# Patient Record
Sex: Male | Born: 1970 | Race: White | Hispanic: No | Marital: Married | Smoking: Former smoker
Health system: Southern US, Community
[De-identification: ages and names within clinical notes are randomized; demographics above are authoritative.]

## PROBLEM LIST (undated history)

## (undated) DIAGNOSIS — I10 Essential (primary) hypertension: Secondary | ICD-10-CM

## (undated) DIAGNOSIS — E119 Type 2 diabetes mellitus without complications: Secondary | ICD-10-CM

## (undated) HISTORY — PX: FRACTURE SURGERY: SHX138

## (undated) HISTORY — PX: ELBOW FRACTURE SURGERY: SHX616

## (undated) HISTORY — PX: JOINT REPLACEMENT: SHX530

## (undated) HISTORY — PX: WRIST ARTHROPLASTY: SHX1088

## (undated) HISTORY — PX: APPENDECTOMY: SHX54

---

## 2008-02-07 ENCOUNTER — Ambulatory Visit: Payer: Self-pay | Admitting: Endocrinology

## 2008-02-25 ENCOUNTER — Ambulatory Visit: Payer: Self-pay | Admitting: Endocrinology

## 2008-08-31 ENCOUNTER — Emergency Department: Payer: Self-pay | Admitting: Emergency Medicine

## 2010-01-19 ENCOUNTER — Emergency Department: Payer: Self-pay | Admitting: Emergency Medicine

## 2011-07-21 ENCOUNTER — Ambulatory Visit: Payer: Self-pay | Admitting: Gastroenterology

## 2011-08-16 ENCOUNTER — Ambulatory Visit: Payer: Self-pay | Admitting: Gastroenterology

## 2011-09-17 ENCOUNTER — Ambulatory Visit: Payer: Self-pay | Admitting: Orthopedic Surgery

## 2012-01-03 ENCOUNTER — Ambulatory Visit: Payer: Self-pay

## 2012-12-30 ENCOUNTER — Ambulatory Visit: Payer: Self-pay | Admitting: Orthopedic Surgery

## 2018-05-22 ENCOUNTER — Other Ambulatory Visit: Payer: Self-pay | Admitting: Family Medicine

## 2018-05-22 ENCOUNTER — Ambulatory Visit
Admission: RE | Admit: 2018-05-22 | Discharge: 2018-05-22 | Disposition: A | Discharge status: Home / Self Care | Payer: BLUE CROSS/BLUE SHIELD | Source: Ambulatory Visit | Attending: Family Medicine | Admitting: Family Medicine

## 2018-05-22 ENCOUNTER — Encounter (INDEPENDENT_AMBULATORY_CARE_PROVIDER_SITE_OTHER): Payer: Self-pay

## 2018-05-22 DIAGNOSIS — M7989 Other specified soft tissue disorders: Secondary | ICD-10-CM | POA: Insufficient documentation

## 2018-10-08 ENCOUNTER — Emergency Department: Payer: BC Managed Care – PPO

## 2018-10-08 ENCOUNTER — Other Ambulatory Visit: Payer: Self-pay

## 2018-10-08 ENCOUNTER — Emergency Department
Admission: EM | Admit: 2018-10-08 | Discharge: 2018-10-08 | Disposition: A | Discharge status: Home / Self Care | Payer: BC Managed Care – PPO | Source: Home / Self Care | Attending: Emergency Medicine | Admitting: Emergency Medicine

## 2018-10-08 DIAGNOSIS — Z7982 Long term (current) use of aspirin: Secondary | ICD-10-CM | POA: Insufficient documentation

## 2018-10-08 DIAGNOSIS — L03116 Cellulitis of left lower limb: Secondary | ICD-10-CM | POA: Insufficient documentation

## 2018-10-08 DIAGNOSIS — A4901 Methicillin susceptible Staphylococcus aureus infection, unspecified site: Secondary | ICD-10-CM | POA: Diagnosis not present

## 2018-10-08 DIAGNOSIS — L03119 Cellulitis of unspecified part of limb: Secondary | ICD-10-CM

## 2018-10-08 DIAGNOSIS — Z79899 Other long term (current) drug therapy: Secondary | ICD-10-CM | POA: Insufficient documentation

## 2018-10-08 DIAGNOSIS — Z794 Long term (current) use of insulin: Secondary | ICD-10-CM | POA: Insufficient documentation

## 2018-10-08 DIAGNOSIS — E1169 Type 2 diabetes mellitus with other specified complication: Secondary | ICD-10-CM | POA: Diagnosis not present

## 2018-10-08 LAB — COMPREHENSIVE METABOLIC PANEL
ALT: 23 U/L (ref 0–44)
AST: 18 U/L (ref 15–41)
Albumin: 4.2 g/dL (ref 3.5–5.0)
Alkaline Phosphatase: 100 U/L (ref 38–126)
Anion gap: 14 (ref 5–15)
BUN: 25 mg/dL — ABNORMAL HIGH (ref 6–20)
CO2: 18 mmol/L — ABNORMAL LOW (ref 22–32)
Calcium: 8.4 mg/dL — ABNORMAL LOW (ref 8.9–10.3)
Chloride: 105 mmol/L (ref 98–111)
Creatinine, Ser: 1.1 mg/dL (ref 0.61–1.24)
GFR calc Af Amer: >60 mL/min (ref >60)
GFR calc non Af Amer: >60 mL/min (ref >60)
Glucose, Bld: 309 mg/dL — ABNORMAL HIGH (ref 70–99)
Potassium: 4 mmol/L (ref 3.5–5.1)
Sodium: 137 mmol/L (ref 135–145)
Total Bilirubin: 1.3 mg/dL — ABNORMAL HIGH (ref 0.3–1.2)
Total Protein: 7.2 g/dL (ref 6.5–8.1)

## 2018-10-08 LAB — CBC WITH DIFFERENTIAL/PLATELET
Abs Immature Granulocytes: 0.06 10*3/uL (ref 0.00–0.07)
Basophils Absolute: 0.1 10*3/uL (ref 0.0–0.1)
Basophils Relative: 0 %
Eosinophils Absolute: 0.1 10*3/uL (ref 0.0–0.5)
Eosinophils Relative: 0 %
HCT: 42 % (ref 39.0–52.0)
Hemoglobin: 14.4 g/dL (ref 13.0–17.0)
Immature Granulocytes: 1 %
Lymphocytes Relative: 14 %
Lymphs Abs: 1.6 10*3/uL (ref 0.7–4.0)
MCH: 30.7 pg (ref 26.0–34.0)
MCHC: 34.3 g/dL (ref 30.0–36.0)
MCV: 89.6 fL (ref 80.0–100.0)
Monocytes Absolute: 1.3 10*3/uL — ABNORMAL HIGH (ref 0.1–1.0)
Monocytes Relative: 11 %
Neutro Abs: 8.8 10*3/uL — ABNORMAL HIGH (ref 1.7–7.7)
Neutrophils Relative %: 74 %
Platelets: 291 10*3/uL (ref 150–400)
RBC: 4.69 MIL/uL (ref 4.22–5.81)
RDW: 12.5 % (ref 11.5–15.5)
WBC: 11.8 10*3/uL — ABNORMAL HIGH (ref 4.0–10.5)
nRBC: 0 % (ref 0.0–0.2)

## 2018-10-08 LAB — LACTIC ACID, PLASMA: Lactic Acid, Venous: 1.6 mmol/L (ref 0.5–1.9)

## 2018-10-08 MED ORDER — VANCOMYCIN HCL IN DEXTROSE 1-5 GM/200ML-% IV SOLN
1000.0000 mg | INTRAVENOUS | Status: AC
  Administered 2018-10-08: 1000 mg via INTRAVENOUS
  Filled 2018-10-08: qty 200

## 2018-10-08 MED ORDER — SODIUM CHLORIDE 0.9 % IV BOLUS
500.0000 mL | Freq: Once | INTRAVENOUS | Status: AC
  Administered 2018-10-08: 500 mL via INTRAVENOUS

## 2018-10-08 MED ORDER — CLINDAMYCIN HCL 300 MG PO CAPS: 300.0000 mg | ORAL_CAPSULE | Freq: Three times a day (TID) | ORAL | 0 refills | Status: DC

## 2018-10-08 MED ORDER — LISINOPRIL 10 MG PO TABS: 10.0000 mg | ORAL_TABLET | Freq: Every day | ORAL | 0 refills | Status: DC

## 2018-10-08 MED ORDER — LISINOPRIL 10 MG PO TABS
10.0000 mg | ORAL_TABLET | Freq: Once | ORAL | Status: AC
  Administered 2018-10-08: 10 mg via ORAL
  Filled 2018-10-08: qty 1

## 2018-10-08 NOTE — ED Notes (Signed)
Attempted IV access x 1 unsuccessful, IV infiltrated when flushed.

## 2018-10-08 NOTE — Discharge Instructions (Signed)
You have been seen today in the Emergency Department (ED) for cellulitis, a superficial skin infection. Please take your antibiotics as prescribed for their ENTIRE prescribed duration.  Take Tylenol or Motrin as needed for pain, but only as written on the box.  ° °Please follow up with your doctor or in the ED in 24-48 hours for recheck of your infection if you are not improving.  Call your doctor sooner or return to the ED if you develop worsening signs of infection such as: increased redness, increased pain, pus, fever, or other symptoms that concern you. ° °

## 2018-10-08 NOTE — ED Triage Notes (Signed)
Pt with redness and increased head noted to top of left foot. Pt states he is not sure if he broke his foot, pt has osteogenesis imperfecta. Pt states also has a history of cellulitis. 3+ pedal pulse. Pt denies known fever.

## 2018-10-08 NOTE — ED Provider Notes (Signed)
[facility] Emergency Department Provider Note  ____________________________________________   First MD Initiated Contact with Patient 10/08/18 0825     (approximate)  I have reviewed the triage vital signs and the nursing notes.   HISTORY  Chief Complaint Foot Pain  HPI Damon Anderson is a 48 y.o. male history of osteogenesis imperfecta, hypertension  Patient reports that  for couple days had some increased redness over a spot over the top of his left foot that is painful to walk on.  He first noticed it after he been sitting at his computer desk.  He has broken the foot in the past, but has not suffered any trauma or injury to this foot.  The pain occurred after sitting at the computer and reports it looks red.  Has had cellulitis in his foot before.  He has not had a fever.  He is otherwise been feeling well except it hurts over the top of his foot  No nausea vomiting.  No fevers or chills.  No cough.  No exposure anyone with coronavirus.  He does reports that he recently ran out of his lisinopril and is due to see his primary doctor for a refill on this, but he has all his other medications  No past medical history on file.  There are no active problems to display for this patient.     Prior to Admission medications   Medication Sig Start Date End Date Taking? Authorizing Provider  aspirin 325 MG tablet Take 325 mg by mouth daily.   Yes [provider]  cetirizine (ZYRTEC) 10 MG tablet Take 10 mg by mouth daily.   Yes [provider]  insulin aspart (NOVOLOG) 100 UNIT/ML injection USE UP TO 120 UNITS VIA INSULIN PUMP DAILY 01/20/18  Yes [provider]  insulin glargine (LANTUS) 100 UNIT/ML injection Inject 56 Units into the skin daily as needed. 08/07/18 08/07/19 Yes [provider]  lisinopril-hydrochlorothiazide (ZESTORETIC) 10-12.5 MG tablet Take 1 tablet by mouth daily.   Yes [provider]  metFORMIN  (GLUCOPHAGE-XR) 500 MG 24 hr tablet Take 500 mg by mouth daily with supper. 07/19/18 07/19/19 Yes [provider]  Multiple Vitamin (MULTIVITAMIN WITH MINERALS) TABS tablet Take 1 tablet by mouth daily.   Yes [provider]  rosuvastatin (CRESTOR) 40 MG tablet Take 40 mg by mouth daily. 07/19/18  Yes [provider]  clindamycin (CLEOCIN) 300 MG capsule Take 1 capsule (300 mg total) by mouth 3 (three) times daily. 10/08/18   Sharyn CreamerQuale, Jami Ohlin, MD  lisinopril (ZESTRIL) 10 MG tablet Take 1 tablet (10 mg total) by mouth daily. 10/08/18   Sharyn CreamerQuale, Lanissa Cashen, MD    Allergies Demerol [meperidine hcl]  No family history on file.  Social History Social History   Tobacco Use  . Smoking status: Not on file  Substance Use Topics  . Alcohol use: Not on file  . Drug use: Not on file  Patient does not report any drug or alcohol abuse  Review of Systems Constitutional: No fever/chills Eyes: No visual changes. ENT: No sore throat. Cardiovascular: Denies chest pain. Respiratory: Denies shortness of breath. Gastrointestinal: No abdominal pain.   Genitourinary: Negative for dysuria. Musculoskeletal: Negative for back pain.  See HPI.  Discomfort and pain only across the top of the left foot. Skin: Negative for rash. Neurological: Negative for headaches, areas of focal weakness or numbness.    ____________________________________________   PHYSICAL EXAM:  VITAL SIGNS: ED Triage Vitals  Enc Vitals Group  BP 10/08/18 0441 (!) 159/81     Pulse Rate 10/08/18 0441 (!) 117     Resp 10/08/18 0441 18     Temp 10/08/18 0441 98.4 F (36.9 C)     Temp Source 10/08/18 0441 Oral     SpO2 10/08/18 0441 95 %     Weight 10/08/18 0442 230 lb (104.3 kg)     Height 10/08/18 0442 5\' 8"  (1.727 m)     Head Circumference --      Peak Flow --      Pain Score 10/08/18 0501 7     Pain Loc --      Pain Edu? --      Excl. in [place]? --     Constitutional: Alert and oriented. Well appearing and  in no acute distress.  He is very pleasant. Eyes: Conjunctivae are normal. Head: Atraumatic. Nose: No congestion/rhinnorhea. Mouth/Throat: Mucous membranes are moist. Neck: No stridor.  Cardiovascular: Normal rate, regular rhythm. Grossly normal heart sounds.  Good peripheral circulation. Respiratory: Normal respiratory effort.  No retractions. Lungs CTAB. Gastrointestinal: Soft and nontender. No distention. Musculoskeletal:  Lower Extremities  No edema. Normal DP/PT pulses bilateral with good cap refill.  Normal neuro-motor function lower extremities bilateral.  RIGHT Right lower extremity demonstrates normal strength, good use of all muscles. No edema bruising or contusions of the right hip, right knee, right ankle. Full range of motion of the right lower extremity without pain. No pain on axial loading. No evidence of trauma.  LEFT Left lower extremity demonstrates normal strength, good use of all muscles simply reports discomfort across the top of the left foot. No edema bruising or contusions of the hip,  knee, ankle.  He does however have an area approximately 2-1/2 x 2 inches overlying the dorsum of the foot only that starts just proximal from the toes over the top of the foot that is red, slightly warm, and mildly tender to touch.  There is no crepitance.  There is no weeping lesion or blistering.  There is no deformity.  Full range of motion of the left lower extremity without pain kept he reports discomfort with top of the foot. No pain on axial loading. No evidence of trauma.   Neurologic:  Normal speech and language. No gross focal neurologic deficits are appreciated.  Skin:  Skin is warm, dry and intact. No rash noted. Psychiatric: Mood and affect are normal. Speech and behavior are normal.  ____________________________________________   LABS (all labs ordered are listed, but only abnormal results are displayed)  Labs Reviewed  COMPREHENSIVE METABOLIC PANEL - Abnormal;  Notable for the following components:      Result Value   CO2 18 (*)    Glucose, Bld 309 (*)    BUN 25 (*)    Calcium 8.4 (*)    Total Bilirubin 1.3 (*)    All other components within normal limits  CBC WITH DIFFERENTIAL/PLATELET - Abnormal; Notable for the following components:   WBC 11.8 (*)    Neutro Abs 8.8 (*)    Monocytes Absolute 1.3 (*)    All other components within normal limits  CULTURE, BLOOD (ROUTINE X 2)  CULTURE, BLOOD (ROUTINE X 2)  LACTIC ACID, PLASMA   ____________________________________________  EKG   ____________________________________________  RADIOLOGY  Dg Foot Complete Left  Result Date: 10/08/2018 CLINICAL DATA:  Left foot pain and erythema. Osteogenesis imperfecta. EXAM: LEFT FOOT - COMPLETE 3+ VIEW COMPARISON:  None. FINDINGS: There is no evidence of acute fracture  or dislocation. Generalized osteopenia is noted. No focal lytic or sclerotic bone lesions identified. Degenerative spurring and chronic deformity of articular surface of 2nd metatarsal head noted. Mild soft tissue swelling is seen along the dorsal aspect of the distal metacarpals. No evidence of soft tissue gas or radiopaque foreign body. IMPRESSION: Mild dorsal soft tissue swelling. No acute osseous abnormality. Electronically Signed   By: Myles RosenthalJohn  Stahl M.D.   On: 10/08/2018 06:43   Imaging reviewed, dorsal foot swelling no acute fractures. ____________________________________________   PROCEDURES  Procedure(s) performed: None  Procedures  Critical Care performed: No  ____________________________________________   INITIAL IMPRESSION / ASSESSMENT AND PLAN / ED COURSE  Pertinent labs & imaging results that were available during my care of the patient were reviewed by me and considered in my medical decision making (see chart for details).   Patient presents for discomfort and pain over the dorsum of the left foot.  Appears to be atraumatic in nature with reassuring imaging.  He does  have an area that he reports he had cellulitis before as well as psoriasis or other longstanding lesion that seems to have flared up, does appear to be likely early onset of cellulitis.  He is hypertensive, fully awake and alert nontoxic-appearing.  Reports he ran of his lisinopril few days ago, will provide here and also discussed with him reviewed in [facility] and we will give him a one-month supply of his lisinopril so he can get in contact with his PCP as access has been slightly limited due to COVID.  Additionally he is minimally tachycardic on presentation, he does not meet criteria for sepsis though his white count has not exceeded 12,000 but does have a neurotrphil presence.  Reassuring exam.  No evidence of streaking or rapidly advancing infection.  Is fully awake and alert nontoxic.  Given his history of diabetes, we will send blood cultures and check a single lactic acid.  Provide some mild hydration as well as start him on vancomycin for which we discussed risks and benefits of this medication prior to initiation and after discussing risks and benefits of multiple antibiotics we will utilize clindamycin orally as outpatient and he has no history of prior C. Difficile.    Clinical Course as of Oct 07 1112  Sun Oct 08, 2018  1035 Checked on patient, vancomycin almost complete.  He reports he is feeling well.  He appears well, fully alert.  Heart rate 89, saturation 97% on room air.  Understanding and agreeable with careful return precautions, reviewed careful return precautions regarding his foot and any signs of worsening cellulitis.  Patient in agreement.  Also notes blood cultures were drawn and pending.   [MQ]    Clinical Course User Index [MQ] Sharyn CreamerQuale, Norman Bier, MD   Vitals:   10/08/18 0900 10/08/18 1058  BP: (!) 174/88 140/70  Pulse:  95  Resp: 18 18  Temp:    SpO2:  99%     ____________________________________________   FINAL CLINICAL IMPRESSION(S) / ED DIAGNOSES   Final diagnoses:  Cellulitis of foot        Note:  This document was prepared using Dragon voice recognition software and may include unintentional dictation errors       Sharyn CreamerQuale, Gerald Honea, MD 10/08/18 1114

## 2018-10-09 ENCOUNTER — Other Ambulatory Visit: Payer: Self-pay

## 2018-10-09 ENCOUNTER — Encounter: Payer: Self-pay | Admitting: Emergency Medicine

## 2018-10-09 ENCOUNTER — Inpatient Hospital Stay
Admission: EM | Admit: 2018-10-09 | Discharge: 2018-10-13 | DRG: 629 | Disposition: A | Discharge status: Home Health | Payer: BC Managed Care – PPO | Attending: Specialist | Admitting: Specialist

## 2018-10-09 DIAGNOSIS — M869 Osteomyelitis, unspecified: Secondary | ICD-10-CM | POA: Diagnosis present

## 2018-10-09 DIAGNOSIS — Z9641 Presence of insulin pump (external) (internal): Secondary | ICD-10-CM

## 2018-10-09 DIAGNOSIS — Q780 Osteogenesis imperfecta: Secondary | ICD-10-CM | POA: Diagnosis not present

## 2018-10-09 DIAGNOSIS — I10 Essential (primary) hypertension: Secondary | ICD-10-CM

## 2018-10-09 DIAGNOSIS — Z87311 Personal history of (healed) other pathological fracture: Secondary | ICD-10-CM

## 2018-10-09 DIAGNOSIS — Z79899 Other long term (current) drug therapy: Secondary | ICD-10-CM

## 2018-10-09 DIAGNOSIS — E785 Hyperlipidemia, unspecified: Secondary | ICD-10-CM | POA: Diagnosis present

## 2018-10-09 DIAGNOSIS — E119 Type 2 diabetes mellitus without complications: Secondary | ICD-10-CM | POA: Diagnosis not present

## 2018-10-09 DIAGNOSIS — E11621 Type 2 diabetes mellitus with foot ulcer: Secondary | ICD-10-CM

## 2018-10-09 DIAGNOSIS — E1140 Type 2 diabetes mellitus with diabetic neuropathy, unspecified: Secondary | ICD-10-CM | POA: Diagnosis present

## 2018-10-09 DIAGNOSIS — B9561 Methicillin susceptible Staphylococcus aureus infection as the cause of diseases classified elsewhere: Secondary | ICD-10-CM | POA: Diagnosis present

## 2018-10-09 DIAGNOSIS — R7881 Bacteremia: Secondary | ICD-10-CM | POA: Diagnosis present

## 2018-10-09 DIAGNOSIS — Z794 Long term (current) use of insulin: Secondary | ICD-10-CM | POA: Diagnosis not present

## 2018-10-09 DIAGNOSIS — L03116 Cellulitis of left lower limb: Secondary | ICD-10-CM

## 2018-10-09 DIAGNOSIS — Z872 Personal history of diseases of the skin and subcutaneous tissue: Secondary | ICD-10-CM

## 2018-10-09 DIAGNOSIS — Z87891 Personal history of nicotine dependence: Secondary | ICD-10-CM | POA: Diagnosis not present

## 2018-10-09 DIAGNOSIS — A4901 Methicillin susceptible Staphylococcus aureus infection, unspecified site: Secondary | ICD-10-CM

## 2018-10-09 DIAGNOSIS — Z1159 Encounter for screening for other viral diseases: Secondary | ICD-10-CM

## 2018-10-09 DIAGNOSIS — E1169 Type 2 diabetes mellitus with other specified complication: Secondary | ICD-10-CM | POA: Diagnosis present

## 2018-10-09 DIAGNOSIS — L539 Erythematous condition, unspecified: Secondary | ICD-10-CM

## 2018-10-09 DIAGNOSIS — Z7982 Long term (current) use of aspirin: Secondary | ICD-10-CM

## 2018-10-09 DIAGNOSIS — Z23 Encounter for immunization: Secondary | ICD-10-CM

## 2018-10-09 DIAGNOSIS — Z8614 Personal history of Methicillin resistant Staphylococcus aureus infection: Secondary | ICD-10-CM

## 2018-10-09 DIAGNOSIS — Z885 Allergy status to narcotic agent status: Secondary | ICD-10-CM

## 2018-10-09 DIAGNOSIS — E669 Obesity, unspecified: Secondary | ICD-10-CM | POA: Diagnosis present

## 2018-10-09 DIAGNOSIS — M009 Pyogenic arthritis, unspecified: Secondary | ICD-10-CM | POA: Diagnosis present

## 2018-10-09 DIAGNOSIS — F329 Major depressive disorder, single episode, unspecified: Secondary | ICD-10-CM | POA: Diagnosis present

## 2018-10-09 HISTORY — DX: Essential (primary) hypertension: I10

## 2018-10-09 HISTORY — DX: Type 2 diabetes mellitus without complications: E11.9

## 2018-10-09 LAB — BASIC METABOLIC PANEL
Anion gap: 11 (ref 5–15)
BUN: 25 mg/dL — ABNORMAL HIGH (ref 6–20)
CO2: 22 mmol/L (ref 22–32)
Calcium: 8.5 mg/dL — ABNORMAL LOW (ref 8.9–10.3)
Chloride: 104 mmol/L (ref 98–111)
Creatinine, Ser: 1.1 mg/dL (ref 0.61–1.24)
GFR calc Af Amer: >60 mL/min (ref >60)
GFR calc non Af Amer: >60 mL/min (ref >60)
Glucose, Bld: 228 mg/dL — ABNORMAL HIGH (ref 70–99)
Potassium: 3.8 mmol/L (ref 3.5–5.1)
Sodium: 137 mmol/L (ref 135–145)

## 2018-10-09 LAB — BLOOD CULTURE ID PANEL (REFLEXED)

## 2018-10-09 LAB — HEMOGLOBIN A1C
Hgb A1c MFr Bld: 8.1 % — ABNORMAL HIGH (ref 4.8–5.6)
Mean Plasma Glucose: 185.77 mg/dL

## 2018-10-09 LAB — CBC WITH DIFFERENTIAL/PLATELET
Abs Immature Granulocytes: 0.04 10*3/uL (ref 0.00–0.07)
Basophils Absolute: 0 10*3/uL (ref 0.0–0.1)
Basophils Relative: 0 %
Eosinophils Absolute: 0.1 10*3/uL (ref 0.0–0.5)
Eosinophils Relative: 1 %
HCT: 39.4 % (ref 39.0–52.0)
Hemoglobin: 13.7 g/dL (ref 13.0–17.0)
Immature Granulocytes: 1 %
Lymphocytes Relative: 11 %
Lymphs Abs: 1 10*3/uL (ref 0.7–4.0)
MCH: 30.8 pg (ref 26.0–34.0)
MCHC: 34.8 g/dL (ref 30.0–36.0)
MCV: 88.5 fL (ref 80.0–100.0)
Monocytes Absolute: 0.8 10*3/uL (ref 0.1–1.0)
Monocytes Relative: 10 %
Neutro Abs: 6.8 10*3/uL (ref 1.7–7.7)
Neutrophils Relative %: 77 %
Platelets: 289 10*3/uL (ref 150–400)
RBC: 4.45 MIL/uL (ref 4.22–5.81)
RDW: 12.5 % (ref 11.5–15.5)
WBC: 8.7 10*3/uL (ref 4.0–10.5)
nRBC: 0 % (ref 0.0–0.2)

## 2018-10-09 LAB — GLUCOSE, CAPILLARY
Glucose-Capillary: 167 mg/dL — ABNORMAL HIGH (ref 70–99)
Glucose-Capillary: 86 mg/dL (ref 70–99)

## 2018-10-09 MED ORDER — ROSUVASTATIN CALCIUM 20 MG PO TABS
40.0000 mg | ORAL_TABLET | Freq: Every day | ORAL | Status: DC
  Administered 2018-10-10 – 2018-10-13 (×4): 40 mg via ORAL
  Filled 2018-10-09 (×4): qty 4
  Filled 2018-10-09 (×4): qty 2

## 2018-10-09 MED ORDER — PNEUMOCOCCAL VAC POLYVALENT 25 MCG/0.5ML IJ INJ
0.5000 mL | INJECTION | INTRAMUSCULAR | Status: AC
  Administered 2018-10-13: 0.5 mL via INTRAMUSCULAR
  Filled 2018-10-09: qty 0.5

## 2018-10-09 MED ORDER — ACETAMINOPHEN 325 MG PO TABS
650.0000 mg | ORAL_TABLET | Freq: Four times a day (QID) | ORAL | Status: DC | PRN
  Administered 2018-10-11: 650 mg via ORAL
  Filled 2018-10-09: qty 2

## 2018-10-09 MED ORDER — HYDROCHLOROTHIAZIDE 12.5 MG PO CAPS
12.5000 mg | ORAL_CAPSULE | Freq: Every day | ORAL | Status: DC
  Administered 2018-10-10 – 2018-10-13 (×4): 12.5 mg via ORAL
  Filled 2018-10-09 (×4): qty 1

## 2018-10-09 MED ORDER — ACETAMINOPHEN 650 MG RE SUPP: 650.0000 mg | Freq: Four times a day (QID) | RECTAL | Status: DC | PRN

## 2018-10-09 MED ORDER — POLYETHYLENE GLYCOL 3350 17 G PO PACK: 17.0000 g | PACK | Freq: Every day | ORAL | Status: DC | PRN

## 2018-10-09 MED ORDER — LISINOPRIL 10 MG PO TABS
10.0000 mg | ORAL_TABLET | Freq: Every day | ORAL | Status: DC
  Administered 2018-10-10 – 2018-10-13 (×4): 10 mg via ORAL
  Filled 2018-10-09 (×4): qty 1

## 2018-10-09 MED ORDER — IBUPROFEN 400 MG PO TABS
400.0000 mg | ORAL_TABLET | Freq: Four times a day (QID) | ORAL | Status: DC | PRN
  Administered 2018-10-11: 400 mg via ORAL
  Filled 2018-10-09: qty 1

## 2018-10-09 MED ORDER — LISINOPRIL-HYDROCHLOROTHIAZIDE 10-12.5 MG PO TABS: 1.0000 | ORAL_TABLET | Freq: Every day | ORAL | Status: DC

## 2018-10-09 MED ORDER — SODIUM CHLORIDE 0.9 % IV SOLN
2.0000 g | Freq: Once | INTRAVENOUS | Status: AC
  Administered 2018-10-09: 2 g via INTRAVENOUS
  Filled 2018-10-09: qty 2000

## 2018-10-09 MED ORDER — ONDANSETRON HCL 4 MG PO TABS: 4.0000 mg | ORAL_TABLET | Freq: Four times a day (QID) | ORAL | Status: DC | PRN

## 2018-10-09 MED ORDER — ENOXAPARIN SODIUM 40 MG/0.4ML ~~LOC~~ SOLN
40.0000 mg | SUBCUTANEOUS | Status: DC
  Administered 2018-10-09 – 2018-10-12 (×4): 40 mg via SUBCUTANEOUS
  Filled 2018-10-09 (×4): qty 0.4

## 2018-10-09 MED ORDER — SODIUM CHLORIDE 0.9 % IV SOLN
2.0000 g | INTRAVENOUS | Status: DC
  Administered 2018-10-09 – 2018-10-11 (×11): 2 g via INTRAVENOUS
  Filled 2018-10-09 (×2): qty 2000
  Filled 2018-10-09: qty 2
  Filled 2018-10-09: qty 2000
  Filled 2018-10-09: qty 2
  Filled 2018-10-09 (×3): qty 2000
  Filled 2018-10-09: qty 2
  Filled 2018-10-09: qty 2000
  Filled 2018-10-09: qty 2
  Filled 2018-10-09: qty 2000
  Filled 2018-10-09: qty 2
  Filled 2018-10-09: qty 2000
  Filled 2018-10-09 (×2): qty 2
  Filled 2018-10-09: qty 2000

## 2018-10-09 MED ORDER — INSULIN PUMP
Freq: Three times a day (TID) | SUBCUTANEOUS | Status: DC
  Administered 2018-10-09: 18.2 via SUBCUTANEOUS
  Administered 2018-10-10: 03:00:00 via SUBCUTANEOUS
  Administered 2018-10-10: 1 via SUBCUTANEOUS
  Administered 2018-10-10: 10.2 via SUBCUTANEOUS
  Administered 2018-10-10: 12.3 via SUBCUTANEOUS
  Administered 2018-10-10: 15.5 via SUBCUTANEOUS
  Administered 2018-10-11: 10.8 via SUBCUTANEOUS
  Administered 2018-10-11 (×3): 1 via SUBCUTANEOUS
  Administered 2018-10-11: 18:00:00 via SUBCUTANEOUS
  Administered 2018-10-12 (×2): 1 via SUBCUTANEOUS
  Filled 2018-10-09: qty 1

## 2018-10-09 MED ORDER — ALBUTEROL SULFATE (2.5 MG/3ML) 0.083% IN NEBU: 2.5000 mg | INHALATION_SOLUTION | RESPIRATORY_TRACT | Status: DC | PRN

## 2018-10-09 MED ORDER — NAFCILLIN SODIUM 2 G IJ SOLR
2.0000 g | INTRAMUSCULAR | Status: DC
  Filled 2018-10-09 (×5): qty 2000

## 2018-10-09 MED ORDER — ASPIRIN EC 325 MG PO TBEC
325.0000 mg | DELAYED_RELEASE_TABLET | Freq: Every day | ORAL | Status: DC
  Administered 2018-10-10 – 2018-10-13 (×4): 325 mg via ORAL
  Filled 2018-10-09 (×4): qty 1

## 2018-10-09 MED ORDER — ONDANSETRON HCL 4 MG/2ML IJ SOLN: 4.0000 mg | Freq: Four times a day (QID) | INTRAMUSCULAR | Status: DC | PRN

## 2018-10-09 NOTE — ED Provider Notes (Signed)
[facility] Emergency Department Provider Note   ____________________________________________   First MD Initiated Contact with Patient 10/09/18 1402     (approximate)  I have reviewed the triage vital signs and the nursing notes.   HISTORY  Chief Complaint Abnormal Lab    HPI Damon Anderson is a 48 y.o. male recent diagnosis of cellulitis as well as history of osteogenesis imperfecta.  Patient was seen in ER and evaluated yesterday.  He was called back due to positive blood cultures.   He denies ongoing fevers and chills.  Received vancomycin ER yesterday.  He is taking clindamycin he took the first dose this morning.  Reports the area of the top of the foot pain is decreased slightly and he feels like the redness and infection looked a little better this morning, but after wearing his socks for a while the area over the top of the left foot continues to remain red but does not appear to be worsening or advancing.  History of frequent infections and cellulitis involving his foot in the past.  No nausea vomiting.  No headache fevers or chills.  No coronavirus symptoms  History reviewed. No pertinent past medical history.  There are no active problems to display for this patient.     Prior to Admission medications   Medication Sig Start Date End Date Taking? Authorizing Provider  aspirin 325 MG tablet Take 325 mg by mouth daily.    [provider]  cetirizine (ZYRTEC) 10 MG tablet Take 10 mg by mouth daily.    [provider]  clindamycin (CLEOCIN) 300 MG capsule Take 1 capsule (300 mg total) by mouth 3 (three) times daily. 10/08/18   Delman Kitten, MD  insulin aspart (NOVOLOG) 100 UNIT/ML injection USE UP TO 120 UNITS VIA INSULIN PUMP DAILY 01/20/18   [provider]  insulin glargine (LANTUS) 100 UNIT/ML injection Inject 56 Units into the skin daily as needed. 08/07/18 08/07/19  [provider]  lisinopril (ZESTRIL) 10 MG  tablet Take 1 tablet (10 mg total) by mouth daily. 10/08/18   Delman Kitten, MD  lisinopril-hydrochlorothiazide (ZESTORETIC) 10-12.5 MG tablet Take 1 tablet by mouth daily.    [provider]  metFORMIN (GLUCOPHAGE-XR) 500 MG 24 hr tablet Take 500 mg by mouth daily with supper. 07/19/18 07/19/19  [provider]  Multiple Vitamin (MULTIVITAMIN WITH MINERALS) TABS tablet Take 1 tablet by mouth daily.    [provider]  rosuvastatin (CRESTOR) 40 MG tablet Take 40 mg by mouth daily. 07/19/18   [provider]    Allergies Demerol [meperidine hcl]  No family history on file.  Social History Social History   Tobacco Use  . Smoking status: Not on file  Substance Use Topics  . Alcohol use: Not on file  . Drug use: Not on file    Review of Systems Constitutional: No fever/chills Eyes: No visual changes. ENT: No sore throat. Cardiovascular: Denies chest pain. Respiratory: Denies shortness of breath. Gastrointestinal: No abdominal pain.   Genitourinary: Negative for dysuria. Musculoskeletal: Negative for back pain.  Redness over top left foot Skin: see HPI Neurological: Negative for headaches, areas of focal weakness or numbness.    ____________________________________________   PHYSICAL EXAM:  VITAL SIGNS: ED Triage Vitals  Enc Vitals Group     BP 10/09/18 1326 (!) 152/84     Pulse Rate 10/09/18 1326 (!) 109     Resp 10/09/18 1326 20     Temp 10/09/18 1326 98.5 F (  36.9 C)     Temp Source 10/09/18 1326 Oral     SpO2 10/09/18 1326 100 %     Weight 10/09/18 1328 230 lb (104.3 kg)     Height 10/09/18 1328 5\' 8"  (1.727 m)     Head Circumference --      Peak Flow --      Pain Score 10/09/18 1327 4     Pain Loc --      Pain Edu? --      Excl. in [place]? --     Constitutional: Alert and oriented. Well appearing and in no acute distress. Eyes: Conjunctivae are normal. Head: Atraumatic. Nose: No congestion/rhinnorhea. Mouth/Throat: Mucous  membranes are moist. Neck: No stridor.  Cardiovascular: Normal rate, regular rhythm. Grossly normal heart sounds.  Good peripheral circulation. Respiratory: Normal respiratory effort.  No retractions. Lungs CTAB. Gastrointestinal: Soft and nontender. No distention. Musculoskeletal: No lower extremity tenderness nor edema septa over the top dorsal surface of the left foot there is and remains a patch of erythema around this and warmth about 2 x 3 cm in size and does not appear to spread notably from visit yesterday, appears stable if not anything may be slightly less erythematous. Neurologic:  Normal speech and language. No gross focal neurologic deficits are appreciated.  Skin:  Skin is warm, dry and intact. No rash noted. Psychiatric: Mood and affect are normal. Speech and behavior are normal.  ____________________________________________   LABS (all labs ordered are listed, but only abnormal results are displayed)  Labs Reviewed  NOVEL CORONAVIRUS, NAA (HOSPITAL ORDER, SEND-OUT TO REF LAB)  CULTURE, BLOOD (ROUTINE X 2)  CULTURE, BLOOD (ROUTINE X 2)  CBC WITH DIFFERENTIAL/PLATELET  BASIC METABOLIC PANEL   ____________________________________________  EKG   ____________________________________________  RADIOLOGY   ____________________________________________   PROCEDURES  Procedure(s) performed: None  Procedures  Critical Care performed: No  ____________________________________________   INITIAL IMPRESSION / ASSESSMENT AND PLAN / ED COURSE  Pertinent labs & imaging results that were available during my care of the patient were reviewed by me and considered in my medical decision making (see chart for details).   Case and care discussed with hospitalist, Dr. Elpidio AnisSudini.  Because of the patient's positive blood cultures, patient will be admitted for further care and work-up under the hospitalist service who requested repeat CBC BMP and blood cultures today.  Discussed  antibiotic choice with hospitalist, will start nafcillin at this time.     Labs reviewed, normal white count today ____________________________________________   FINAL CLINICAL IMPRESSION(S) / ED DIAGNOSES  Final diagnoses:  MSSA (methicillin susceptible Staphylococcus aureus) infection  Cellulitis of left lower extremity        Note:  This document was prepared using Dragon voice recognition software and may include unintentional dictation errors       Sharyn CreamerQuale, Ketzaly Cardella, MD 10/09/18 1524

## 2018-10-09 NOTE — Consult Note (Signed)
NAME: Damon CharlestonRobert Anderson  DOB: 10/04/1970  MRN: [id number]  Date/Time: 10/09/2018 6:13 PM  REQUESTING PROVIDER: Dr. Elpidio AnisSudini Subjective:  REASON FOR CONSULT: Staph aureus bacteremia ?  48 year old male with a history of diabetes mellitus, hypertension, osteogenesis imperfecta,  Is admitted to the hospital because of staph aureus and blood culture.  Patient had presented to the ED yesterday with redness and a knot present on the top of the left foot which he had noticed after sitting at the computer.  He has had cellulitis in that foot before.  He did not have any fever.  Vitals in the ED was noted to be BP of 159/81, heart rate of 117, temperature of 98.4.  Blood cultures were sent.  WBC was 11.8.  X-ray of the foot showed no evidence of acute fracture or dislocation.  He was given a dose of vancomycin and nafcillin and discharged home on clindamycin.  As the blood culture came back as staph aureus he was called back to the ED to be admitted to the hospital.  I am seeing the patient for the same. As per patient he has itching over his legs and has had numerous skin lesions . In the past the left foot had become cellulitic and recently as of April 2020 his PCP had given him topical steroids for possible psoriatic lesions legs He has h/o MRSA many years ago at the site of heparin injections when he was in rehab after a fracture femur    Past medical history Diabetes Hyperlipidemia Hypertension Osteogenesis imperfecta Obesity Osteoarthritis Depression Cellulitis of the legs with swelling of the legs.  For left foot cellulitis in January 2020  Surgical history Appendectomy Arthroplasty hip Fracture right hand  Femoral fracture left with repositioning with screws  Social history Former smoker Drinks beer socially Lives on his own   Family History  Problem Relation Age of Onset  . Diabetes Mother   . Hypertension Father   Asthma Brother Diabetes Brother Stroke Brother   Allergies   Allergen Reactions  . Demerol [Meperidine Hcl] Nausea And Vomiting    ? Current Facility-Administered Medications  Medication Dose Route Frequency Provider Last Rate Last Dose  . acetaminophen (TYLENOL) tablet 650 mg  650 mg Oral Q6H PRN Milagros LollSudini, Srikar, MD       Or  . acetaminophen (TYLENOL) suppository 650 mg  650 mg Rectal Q6H PRN Sudini, Srikar, MD      . albuterol (PROVENTIL) (2.5 MG/3ML) 0.083% nebulizer solution 2.5 mg  2.5 mg Nebulization Q2H PRN Sudini, Srikar, MD      . enoxaparin (LOVENOX) injection 40 mg  40 mg Subcutaneous Q24H Sudini, Srikar, MD      . ibuprofen (ADVIL) tablet 400 mg  400 mg Oral Q6H PRN Sudini, Srikar, MD      . insulin pump   Subcutaneous TID AC, HS, 0200 Sudini, Srikar, MD      . ondansetron (ZOFRAN) tablet 4 mg  4 mg Oral Q6H PRN Sudini, Wardell HeathSrikar, MD       Or  . ondansetron (ZOFRAN) injection 4 mg  4 mg Intravenous Q6H PRN Sudini, Srikar, MD      . polyethylene glycol (MIRALAX / GLYCOLAX) packet 17 g  17 g Oral Daily PRN Milagros LollSudini, Srikar, MD         Abtx:  Anti-infectives (From admission, onward)   Start     Dose/Rate Route Frequency Ordered Stop   10/09/18 1430  nafcillin 2 g in sodium chloride 0.9 % 100 mL IVPB  2 g 200 mL/hr over 30 Minutes Intravenous  Once 10/09/18 1416 10/09/18 1626      REVIEW OF SYSTEMS:  Const: negative fever, negative chills, negative weight loss Eyes: negative diplopia or visual changes, negative eye pain ENT: negative coryza, negative sore throat Resp: negative cough, hemoptysis, dyspnea Cards: negative for chest pain, palpitations, lower extremity edema GU: negative for frequency, dysuria and hematuria GI: Negative for abdominal pain, diarrhea, bleeding, constipation Skin: negative for rash and pruritus Heme: negative for easy bruising and gum/nose bleeding MS: negative for myalgias, arthralgias, back pain and muscle weakness, left foot pain Neurolo:negative for headaches, dizziness, vertigo, memory problems   Psych:  anxiety Endocrine: has insulin pump Allergy/Immunology- as above Objective:  VITALS:  BP 129/77 (BP Location: Right Arm)   Pulse 95   Temp 99.5 F (37.5 C) (Oral)   Resp 16   Ht 5\' 8"  (1.727 m)   Wt 104.3 kg   SpO2 99%   BMI 34.97 kg/m  PHYSICAL EXAM:  General: Alert, cooperative, no distress, appears stated age. pale Head: Normocephalic, without obvious abnormality, atraumatic. Eyes: Conjunctivae clear, anicteric sclerae. Pupils are equal Edema under eyes ENT did  Not examine as he has a mask Neck: Supple, symmetrical, no adenopathy, thyroid: non tender no carotid bruit and no JVD. Back: No CVA tenderness. Lungs: b/l air entry Heart: s1s2 Abdomen: Soft, obese Extremities: excoriated lesions of various healing state, with scars over the shins        Left foot- erythematous, swollen, tender Lymph: Cervical, supraclavicular normal. Neurologic: Grossly non-focal Pertinent Labs Lab Results CBC    Component Value Date/Time   WBC 8.7 10/09/2018 1440   RBC 4.45 10/09/2018 1440   HGB 13.7 10/09/2018 1440   HCT 39.4 10/09/2018 1440   PLT 289 10/09/2018 1440   MCV 88.5 10/09/2018 1440   MCH 30.8 10/09/2018 1440   MCHC 34.8 10/09/2018 1440   RDW 12.5 10/09/2018 1440   LYMPHSABS 1.0 10/09/2018 1440   MONOABS 0.8 10/09/2018 1440   EOSABS 0.1 10/09/2018 1440   BASOSABS 0.0 10/09/2018 1440    CMP Latest Ref Rng & Units 10/09/2018 10/08/2018  Glucose 70 - 99 mg/dL 228(H) 309(H)  BUN 6 - 20 mg/dL 25(H) 25(H)  Creatinine 0.61 - 1.24 mg/dL 1.10 1.10  Sodium 135 - 145 mmol/L 137 137  Potassium 3.5 - 5.1 mmol/L 3.8 4.0  Chloride 98 - 111 mmol/L 104 105  CO2 22 - 32 mmol/L 22 18(L)  Calcium 8.9 - 10.3 mg/dL 8.5(L) 8.4(L)  Total Protein 6.5 - 8.1 g/dL - 7.2  Total Bilirubin 0.3 - 1.2 mg/dL - 1.3(H)  Alkaline Phos 38 - 126 U/L - 100  AST 15 - 41 U/L - 18  ALT 0 - 44 U/L - 23      Microbiology: Recent Results (from the past 240 hour(s))  Blood Culture  (routine x 2)     Status: None (Preliminary result)   Collection Time: 10/08/18  9:46 AM   Specimen: BLOOD  Result Value Ref Range Status   Specimen Description   Final    BLOOD L HAND Performed at [facility], [address] [place], [place] [address]    Special Requests   Final    BOTTLES DRAWN AEROBIC AND ANAEROBIC Blood Culture adequate volume Performed at [facility], [address] [place], [place] [address]    Culture  Setup Time   Final    AEROBIC BOTTLE ONLY GRAM POSITIVE COCCI CRITICAL RESULT CALLED TO, READ  BACK BY AND VERIFIED WITH: LISA CLUTTZ AT 0410 ON 10/09/2018 JJB Performed at [facility], [address] [place], [place] [address]    Culture GRAM POSITIVE COCCI  Final   Report Status PENDING  Incomplete  Blood Culture (routine x 2)     Status: None (Preliminary result)   Collection Time: 10/08/18  9:46 AM   Specimen: BLOOD  Result Value Ref Range Status   Specimen Description BLOOD LAC  Final   Special Requests   Final    BOTTLES DRAWN AEROBIC AND ANAEROBIC Blood Culture adequate volume   Culture  Setup Time   Final    AEROBIC BOTTLE ONLY GRAM POSITIVE COCCI CRITICAL RESULT CALLED TO, READ BACK BY AND VERIFIED WITH: LISA CLUTTZ AT 0410 ON 10/09/2018 JJB Performed at [facility], [address] [place], [place] [address]    Culture GRAM POSITIVE COCCI  Final   Report Status PENDING  Incomplete  Blood Culture ID Panel (Reflexed)     Status: Abnormal   Collection Time: 10/08/18  9:46 AM  Result Value Ref Range Status   Enterococcus species NOT DETECTED NOT DETECTED Final   Listeria monocytogenes NOT DETECTED NOT DETECTED Final   Staphylococcus species DETECTED (A) NOT DETECTED Final    Comment: CRITICAL RESULT CALLED TO, READ BACK BY AND VERIFIED WITH: LISA CLUTTZ AT 0410 ON 10/09/2018 JJB    Staphylococcus aureus (BCID) DETECTED (A) NOT DETECTED Final    Comment: Methicillin (oxacillin) susceptible Staphylococcus  aureus (MSSA). Preferred therapy is anti staphylococcal beta lactam antibiotic (Cefazolin or Nafcillin), unless clinically contraindicated. CRITICAL RESULT CALLED TO, READ BACK BY AND VERIFIED WITH: LISA CLUTTZ AT 0410 ON 10/09/2018 JJB    Methicillin resistance NOT DETECTED NOT DETECTED Final   Streptococcus species NOT DETECTED NOT DETECTED Final   Streptococcus agalactiae NOT DETECTED NOT DETECTED Final   Streptococcus pneumoniae NOT DETECTED NOT DETECTED Final   Streptococcus pyogenes NOT DETECTED NOT DETECTED Final   Acinetobacter baumannii NOT DETECTED NOT DETECTED Final   Enterobacteriaceae species NOT DETECTED NOT DETECTED Final   Enterobacter cloacae complex NOT DETECTED NOT DETECTED Final   Escherichia coli NOT DETECTED NOT DETECTED Final   Klebsiella oxytoca NOT DETECTED NOT DETECTED Final   Klebsiella pneumoniae NOT DETECTED NOT DETECTED Final   Proteus species NOT DETECTED NOT DETECTED Final   Serratia marcescens NOT DETECTED NOT DETECTED Final   Haemophilus influenzae NOT DETECTED NOT DETECTED Final   Neisseria meningitidis NOT DETECTED NOT DETECTED Final   Pseudomonas aeruginosa NOT DETECTED NOT DETECTED Final   Candida albicans NOT DETECTED NOT DETECTED Final   Candida glabrata NOT DETECTED NOT DETECTED Final   Candida krusei NOT DETECTED NOT DETECTED Final   Candida parapsilosis NOT DETECTED NOT DETECTED Final   Candida tropicalis NOT DETECTED NOT DETECTED Final    Comment: Performed at [facility], [address] [place], [place] [address]    IMAGING RESULTS: I have personally reviewed the films ? Impression/Recommendation ? ?Staph aureus bacteremia: Likely source left foot. On nafcillin. We will repeat blood cultures until clear of bacteria. Need podiatrist consult May need MRI of the left foot.   Osteogenesis imperfecta with history of fractures in the past  Diabetes mellitus on insulin  pump.  Last hemoglobin A1c was 9  Hyperlipidemia on  rosuvastatin   ? ___________________________________________________ Discussed with patient and requesting provider Note:  This document was prepared using Dragon voice recognition software and may include unintentional dictation errors.

## 2018-10-09 NOTE — ED Notes (Signed)
ED TO INPATIENT HANDOFF REPORT  ED Nurse Name and Phone #:  Jinny Blossom [phone]  S Name/Age/Gender Damon Anderson 48 y.o. male Room/Bed: ED35A/ED35A  Code Status   Code Status: Full Code  Home/SNF/Other Home Patient oriented to: self, place, time and situation Is this baseline? Yes   Triage Complete: Triage complete  Chief Complaint abnormal lab  Triage Note States was called in by lab due to positive blood cultures. Patient had been seen yesterday for redness to foot.    Allergies Allergies  Allergen Reactions  . Demerol [Meperidine Hcl] Nausea And Vomiting    Level of Care/Admitting Diagnosis ED Disposition    ED Disposition Condition [facility]: [place] [[id number]]  Level of Care: Med-Surg [16]  Covid Evaluation: N/A  Diagnosis: MSSA bacteremia [5009381]  Admitting Physician: Hillary Bow [[id number]]  Attending Physician: Hillary Bow [[id number]]  Estimated length of stay: past midnight tomorrow  Certification:: I certify this patient will need inpatient services for at least 2 midnights  PT Class (Do Not Modify): Inpatient [101]  PT Acc Code (Do Not Modify): Private [1]       B Medical/Surgery History History reviewed. No pertinent past medical history.    A IV Location/Drains/Wounds Patient Lines/Drains/Airways Status   Active Line/Drains/Airways    Name:   Placement date:   Placement time:   Site:   Days:   Peripheral IV 10/09/18 Right Antecubital   10/09/18    1450    Antecubital   less than 1          Intake/Output Last 24 hours No intake or output data in the 24 hours ending 10/09/18 1525  Labs/Imaging Results for orders placed or performed during the hospital encounter of 10/09/18 (from the past 48 hour(s))  CBC WITH DIFFERENTIAL     Status: None   Collection Time: 10/09/18  2:40 PM  Result Value Ref Range   WBC 8.7 4.0 - 10.5 K/uL   RBC 4.45 4.22 - 5.81 MIL/uL   Hemoglobin 13.7 13.0 - 17.0 g/dL   HCT 39.4 39.0 - 52.0 %   MCV 88.5 80.0 - 100.0 fL   MCH 30.8 26.0 - 34.0 pg   MCHC 34.8 30.0 - 36.0 g/dL   RDW 12.5 11.5 - 15.5 %   Platelets 289 150 - 400 K/uL   nRBC 0.0 0.0 - 0.2 %   Neutrophils Relative % 77 %   Neutro Abs 6.8 1.7 - 7.7 K/uL   Lymphocytes Relative 11 %   Lymphs Abs 1.0 0.7 - 4.0 K/uL   Monocytes Relative 10 %   Monocytes Absolute 0.8 0.1 - 1.0 K/uL   Eosinophils Relative 1 %   Eosinophils Absolute 0.1 0.0 - 0.5 K/uL   Basophils Relative 0 %   Basophils Absolute 0.0 0.0 - 0.1 K/uL   Immature Granulocytes 1 %   Abs Immature Granulocytes 0.04 0.00 - 0.07 K/uL    Comment: Performed at [facility], [address] [place], [place] [address]  Basic metabolic panel     Status: Abnormal   Collection Time: 10/09/18  2:40 PM  Result Value Ref Range   Sodium 137 135 - 145 mmol/L   Potassium 3.8 3.5 - 5.1 mmol/L   Chloride 104 98 - 111 mmol/L   CO2 22 22 - 32 mmol/L   Glucose, Bld 228 (H) 70 - 99 mg/dL   BUN 25 (H) 6 - 20 mg/dL   Creatinine, Ser 1.10 0.61 - 1.24  mg/dL   Calcium 8.5 (L) 8.9 - 10.3 mg/dL   GFR calc non Af Amer >60 >60 mL/min   GFR calc Af Amer >60 >60 mL/min   Anion gap 11 5 - 15    Comment: Performed at [facility], [address] [place], [place] [address]   Dg Foot Complete Left  Result Date: 10/08/2018 CLINICAL DATA:  Left foot pain and erythema. Osteogenesis imperfecta. EXAM: LEFT FOOT - COMPLETE 3+ VIEW COMPARISON:  None. FINDINGS: There is no evidence of acute fracture or dislocation. Generalized osteopenia is noted. No focal lytic or sclerotic bone lesions identified. Degenerative spurring and chronic deformity of articular surface of 2nd metatarsal head noted. Mild soft tissue swelling is seen along the dorsal aspect of the distal metacarpals. No evidence of soft tissue gas or radiopaque foreign body. IMPRESSION: Mild dorsal soft tissue swelling. No acute osseous abnormality. Electronically Signed   By: Myles RosenthalJohn  Stahl M.D.    On: 10/08/2018 06:43    Pending Labs Unresulted Labs (From admission, onward)    Start     Ordered   10/16/18 0500  Creatinine, serum  (enoxaparin (LOVENOX)    CrCl >/= 30 ml/min)  Weekly,   STAT    Comments: while on enoxaparin therapy    10/09/18 1514   10/10/18 0500  Basic metabolic panel  Tomorrow morning,   STAT     10/09/18 1514   10/10/18 0500  CBC  Tomorrow morning,   STAT     10/09/18 1514   10/09/18 1514  HIV antibody (Routine Testing)  Add-on,   AD     10/09/18 1514   10/09/18 1514  Hemoglobin A1c  Add-on,   AD     10/09/18 1514   10/09/18 1513  Creatinine, serum  (enoxaparin (LOVENOX)    CrCl >/= 30 ml/min)  Once,   STAT    Comments: Baseline for enoxaparin therapy IF NOT ALREADY DRAWN.    10/09/18 1514   10/09/18 1416  Blood Culture (routine x 2)  BLOOD CULTURE X 2,   STAT     10/09/18 1416   10/09/18 1408  Novel Coronavirus,NAA,(SEND-OUT TO REF LAB - TAT 24-48 hrs); Hosp Order  (Asymptomatic Patients Labs)  ONCE - STAT,   STAT    Question:  Rule Out  Answer:  Yes   10/09/18 1408          Vitals/Pain Today's Vitals   10/09/18 1326 10/09/18 1327 10/09/18 1328  BP: (!) 152/84    Pulse: (!) 109    Resp: 20    Temp: 98.5 F (36.9 C)    TempSrc: Oral    SpO2: 100%    Weight:   104.3 kg  Height:   5\' 8"  (1.727 m)  PainSc:  4      Isolation Precautions No active isolations  Medications Medications  nafcillin 2 g in sodium chloride 0.9 % 100 mL IVPB (has no administration in time range)  enoxaparin (LOVENOX) injection 40 mg (has no administration in time range)  acetaminophen (TYLENOL) tablet 650 mg (has no administration in time range)    Or  acetaminophen (TYLENOL) suppository 650 mg (has no administration in time range)  polyethylene glycol (MIRALAX / GLYCOLAX) packet 17 g (has no administration in time range)  ibuprofen (ADVIL) tablet 400 mg (has no administration in time range)  ondansetron (ZOFRAN) tablet 4 mg (has no administration in time range)     Or  ondansetron (ZOFRAN) injection 4 mg (has no administration in  time range)  albuterol (PROVENTIL) (2.5 MG/3ML) 0.083% nebulizer solution 2.5 mg (has no administration in time range)  insulin pump (has no administration in time range)    Mobility walks Low fall risk   Focused Assessments See assessment   R Recommendations: See Admitting Provider Note  Report given to:   Additional Notes:

## 2018-10-09 NOTE — Progress Notes (Signed)
Advance care planning  Purpose of Encounter MSSA bacteremia and left foot cellulitis  Parties in Attendance Patient  Patients Decisional capacity Alert and oriented.  Able to make medical decisions.  No documented healthcare power of attorney.  But tells me he wants his estranged wife to make decisions if he is unable to.  Her name is Mathenia,CATHLEEN  Discussed in detail regarding MSSA bacteremia and left foot cellulitis.  Treatment plan , prognosis discussed.  All questions answered.  CODE STATUS discussed.  Patient wishes to be a full code at this point.  He tells me that he is unable to get out of bed and take care of himself he would want to be made a DNR/DNI.  Advised patient to have his healthcare power of attorney and ACP documents completed while in the hospital.  Orders entered entered and CODE STATUS changed  Full code  Time spent - 17 minutes

## 2018-10-09 NOTE — Progress Notes (Signed)
Pt Admitted to room 141. Pt is waiting for someone to deliver a part for his insulin pump but has administered his dinner insulin dose. VSS. Updated on POC. In no acute distress at this time. L foot marked around redness.

## 2018-10-09 NOTE — ED Triage Notes (Signed)
States was called in by lab due to positive blood cultures. Patient had been seen yesterday for redness to foot.

## 2018-10-09 NOTE — H&P (Signed)
[facility] - [place] [facility]   PATIENT NAME: Damon CharlestonRobert Anderson    MR#:  [id number]  DATE OF BIRTH:  08/04/1970  DATE OF ADMISSION:  10/09/2018  PRIMARY CARE PHYSICIAN: Burnard LeighHadler, Nortin M, MD   REQUESTING/REFERRING PHYSICIAN: Dr. Fanny BienQuale  CHIEF COMPLAINT:   Chief Complaint  Patient presents with  . Abnormal Lab    HISTORY OF PRESENT ILLNESS:  Damon CharlestonRobert Wiens  is a 48 y.o. male with a known history of diabetes, hyperlipidemia, osteogenesis imperfecta presents to the emergency room for second visit after he was called back due to positive blood cultures collected yesterday.  Patient was seen in the emergency room on 10/08/2018 for left foot cellulitis.  Was given 1 dose of IV vancomycin, blood cultures drawn.  Sent home on clindamycin.  Patient was called in today as his blood cultures have MSSA.  Patient feels his swelling and redness have worsened a little bit.  No discharge.  He does have an area of dry skin that he has had for many years that he used to scratch significantly in the past.  Has been using some unknown topical cream prescribed by his primary care physician and has not been scratching that area.  No fever.  He does feel rundown and has had some palpitations since yesterday.  PAST MEDICAL HISTORY:  History reviewed. No pertinent past medical history.  DM, HL Osteogenesis imperfecta  PAST SURGICAL HISTORY:  None  SOCIAL HISTORY:   Social History   Tobacco Use  . Smoking status: Not on file  Substance Use Topics  . Alcohol use: Not on file    FAMILY HISTORY:   Family History  Problem Relation Age of Onset  . Diabetes Mother   . Hypertension Father     DRUG ALLERGIES:   Allergies  Allergen Reactions  . Demerol [Meperidine Hcl] Nausea And Vomiting    REVIEW OF SYSTEMS:   Review of Systems  Constitutional: Positive for malaise/fatigue. Negative for chills and fever.  HENT: Negative for sore throat.   Eyes: Negative for blurred vision,  double vision and pain.  Respiratory: Negative for cough, hemoptysis, shortness of breath and wheezing.   Cardiovascular: Positive for palpitations. Negative for chest pain, orthopnea and leg swelling.  Gastrointestinal: Negative for abdominal pain, constipation, diarrhea, heartburn, nausea and vomiting.  Genitourinary: Negative for dysuria and hematuria.  Musculoskeletal: Negative for back pain and joint pain.  Skin: Negative for rash.  Neurological: Negative for sensory change, speech change, focal weakness and headaches.  Endo/Heme/Allergies: Does not bruise/bleed easily.  Psychiatric/Behavioral: Negative for depression. The patient is not nervous/anxious.     MEDICATIONS AT HOME:   Prior to Admission medications   Medication Sig Start Date End Date Taking? Authorizing Provider  aspirin 325 MG tablet Take 325 mg by mouth daily.   Yes [provider]  cetirizine (ZYRTEC) 10 MG tablet Take 10 mg by mouth daily.   Yes [provider]  clindamycin (CLEOCIN) 300 MG capsule Take 1 capsule (300 mg total) by mouth 3 (three) times daily. 10/08/18  Yes Sharyn CreamerQuale, Mark, MD  insulin aspart (NOVOLOG) 100 UNIT/ML injection Inject 120 Units into the skin as directed.  01/20/18  Yes [provider]  insulin glargine (LANTUS) 100 UNIT/ML injection Inject 56 Units into the skin daily as needed (backup insulin therapy).  08/07/18 08/07/19 Yes [provider]  lisinopril-hydrochlorothiazide (ZESTORETIC) 10-12.5 MG tablet Take 1 tablet by mouth daily.   Yes [provider]  metFORMIN (GLUCOPHAGE-XR) 500 MG 24  hr tablet Take 500 mg by mouth daily with supper. 07/19/18 07/19/19 Yes [provider]  Multiple Vitamin (MULTIVITAMIN WITH MINERALS) TABS tablet Take 1 tablet by mouth daily.   Yes [provider]  rosuvastatin (CRESTOR) 40 MG tablet Take 40 mg by mouth daily. 07/19/18  Yes [provider]  lisinopril (ZESTRIL) 10 MG tablet Take 1 tablet (10  mg total) by mouth daily. 10/08/18   Sharyn CreamerQuale, Mark, MD     VITAL SIGNS:  Blood pressure (!) 152/84, pulse (!) 109, temperature 98.5 F (36.9 C), temperature source Oral, resp. rate 20, height 5\' 8"  (1.727 m), weight 104.3 kg, SpO2 100 %.  PHYSICAL EXAMINATION:  Physical Exam  GENERAL:  48 y.o.-year-old patient lying in the bed with no acute distress.  EYES: Pupils equal, round, reactive to light and accommodation. No scleral icterus. Extraocular muscles intact.  HEENT: Head atraumatic, normocephalic. Oropharynx and nasopharynx clear. No oropharyngeal erythema, moist oral mucosa  NECK:  Supple, no jugular venous distention. No thyroid enlargement, no tenderness.  LUNGS: Normal breath sounds bilaterally, no wheezing, rales, rhonchi. No use of accessory muscles of respiration.  CARDIOVASCULAR: S1, S2 normal. No murmurs, rubs, or gallops.  ABDOMEN: Soft, nontender, nondistended. Bowel sounds present. No organomegaly or mass.  EXTREMITIES: No pedal edema, cyanosis, or clubbing. + 2 pedal & radial pulses b/l.   NEUROLOGIC: Cranial nerves II through XII are intact. No focal Motor or sensory deficits appreciated b/l PSYCHIATRIC: The patient is alert and oriented x 3. Good affect.  SKIN: No obvious rash, lesion, or ulcer.   LABORATORY PANEL:   CBC Recent Labs  Lab 10/09/18 1440  WBC 8.7  HGB 13.7  HCT 39.4  PLT 289   ------------------------------------------------------------------------------------------------------------------  Chemistries  Recent Labs  Lab 10/08/18 0508  NA 137  K 4.0  CL 105  CO2 18*  GLUCOSE 309*  BUN 25*  CREATININE 1.10  CALCIUM 8.4*  AST 18  ALT 23  ALKPHOS 100  BILITOT 1.3*   ------------------------------------------------------------------------------------------------------------------  Cardiac Enzymes No results for input(s): TROPONINI in the last 168  hours. ------------------------------------------------------------------------------------------------------------------  RADIOLOGY:  Dg Foot Complete Left  Result Date: 10/08/2018 CLINICAL DATA:  Left foot pain and erythema. Osteogenesis imperfecta. EXAM: LEFT FOOT - COMPLETE 3+ VIEW COMPARISON:  None. FINDINGS: There is no evidence of acute fracture or dislocation. Generalized osteopenia is noted. No focal lytic or sclerotic bone lesions identified. Degenerative spurring and chronic deformity of articular surface of 2nd metatarsal head noted. Mild soft tissue swelling is seen along the dorsal aspect of the distal metacarpals. No evidence of soft tissue gas or radiopaque foreign body. IMPRESSION: Mild dorsal soft tissue swelling. No acute osseous abnormality. Electronically Signed   By: Myles RosenthalJohn  Stahl M.D.   On: 10/08/2018 06:43     IMPRESSION AND PLAN:   *MSSA bacteremia secondary to left foot cellulitis.  Will admit patient with IV nafcillin.  Due to palpitations will place him on telemetry monitoring.  Repeat blood cultures.  Ordered CBC and BMP.  Check echocardiogram.  Consult infectious disease.  Discussed with Dr. Noralee Spaceavi Shankar.  *Diabetes mellitus.  Continue patient's home insulin pump.  Order set used.  *Hypertension.  Continue home medications  *DVT prophylaxis with Lovenox  All the records are reviewed and case discussed with ED provider. Management plans discussed with the patient, family and they are in agreement.  CODE STATUS: Full code  TOTAL TIME TAKING CARE OF THIS PATIENT: 40 minutes.   Orie FishermanSrikar R Nettie Cromwell M.D on 10/09/2018 at 3:16  PM  Between 7am to 6pm - Pager - [phone]  After 6pm go to [email or web address] - password [email or web address] [facility]  Office  [phone]  CC: Primary care physician; Leafy Half, MD  Note: This dictation was prepared with Dragon dictation along with smaller phrase technology. Any transcriptional errors that result from this  process are unintentional.

## 2018-10-09 NOTE — Progress Notes (Signed)
PHARMACY - ANTIMICROBIAL STEWARDSHIP  Patient noted to have blood cultures from 6/14 growing MSSA (per BCID).  He was seen in ED 6/14 for cellulitis.  Given vanco x 1 IV in ED then home on clindamycin PO.    Contacted patient to inform him of positive Bcx and to report back to ED and that he will require admission for further work-up and treatment. He understands and is agreeable.    Doreene Eland, PharmD, BCPS.   Work Cell: [phone] 10/09/2018 11:52 AM

## 2018-10-10 ENCOUNTER — Inpatient Hospital Stay: Payer: BC Managed Care – PPO

## 2018-10-10 ENCOUNTER — Inpatient Hospital Stay (HOSPITAL_COMMUNITY)
Admit: 2018-10-10 | Discharge: 2018-10-10 | Disposition: A | Discharge status: Home / Self Care | Payer: BC Managed Care – PPO | Attending: Internal Medicine | Admitting: Internal Medicine

## 2018-10-10 DIAGNOSIS — R7881 Bacteremia: Secondary | ICD-10-CM

## 2018-10-10 LAB — CBC
HCT: 37.4 % — ABNORMAL LOW (ref 39.0–52.0)
Hemoglobin: 13.1 g/dL (ref 13.0–17.0)
MCH: 30.8 pg (ref 26.0–34.0)
MCHC: 35 g/dL (ref 30.0–36.0)
MCV: 88 fL (ref 80.0–100.0)
Platelets: 299 10*3/uL (ref 150–400)
RBC: 4.25 MIL/uL (ref 4.22–5.81)
RDW: 12.4 % (ref 11.5–15.5)
WBC: 8.1 10*3/uL (ref 4.0–10.5)
nRBC: 0 % (ref 0.0–0.2)

## 2018-10-10 LAB — NOVEL CORONAVIRUS, NAA (HOSP ORDER, SEND-OUT TO REF LAB; TAT 18-24 HRS): SARS-CoV-2, NAA: NOT DETECTED

## 2018-10-10 LAB — GLUCOSE, CAPILLARY
Glucose-Capillary: 123 mg/dL — ABNORMAL HIGH (ref 70–99)
Glucose-Capillary: 123 mg/dL — ABNORMAL HIGH (ref 70–99)
Glucose-Capillary: 118 mg/dL — ABNORMAL HIGH (ref 70–99)
Glucose-Capillary: 92 mg/dL (ref 70–99)
Glucose-Capillary: 61 mg/dL — ABNORMAL LOW (ref 70–99)
Glucose-Capillary: 71 mg/dL (ref 70–99)

## 2018-10-10 LAB — BASIC METABOLIC PANEL
Anion gap: 10 (ref 5–15)
BUN: 19 mg/dL (ref 6–20)
CO2: 23 mmol/L (ref 22–32)
Calcium: 8.5 mg/dL — ABNORMAL LOW (ref 8.9–10.3)
Chloride: 110 mmol/L (ref 98–111)
Creatinine, Ser: 1.02 mg/dL (ref 0.61–1.24)
GFR calc Af Amer: >60 mL/min (ref >60)
GFR calc non Af Amer: >60 mL/min (ref >60)
Glucose, Bld: 98 mg/dL (ref 70–99)
Potassium: 3.5 mmol/L (ref 3.5–5.1)
Sodium: 143 mmol/L (ref 135–145)

## 2018-10-10 LAB — ECHOCARDIOGRAM COMPLETE
Height: 68 in
Weight: 3679.04 oz

## 2018-10-10 LAB — MRSA PCR SCREENING: MRSA by PCR: NEGATIVE

## 2018-10-10 MED ORDER — ENSURE PRE-SURGERY PO LIQD
296.0000 mL | Freq: Once | ORAL | Status: AC
  Administered 2018-10-10: 237 mL via ORAL
  Filled 2018-10-10: qty 296

## 2018-10-10 MED ORDER — POVIDONE-IODINE 10 % EX SWAB: 2.0000 "application " | Freq: Once | CUTANEOUS | Status: DC

## 2018-10-10 MED ORDER — LISINOPRIL 10 MG PO TABS: 10.0000 mg | ORAL_TABLET | Freq: Every day | ORAL | Status: DC

## 2018-10-10 MED ORDER — CHLORHEXIDINE GLUCONATE 4 % EX LIQD
60.0000 mL | Freq: Once | CUTANEOUS | Status: AC
  Administered 2018-10-11: 4 via TOPICAL

## 2018-10-10 MED ORDER — GADOBUTROL 1 MMOL/ML IV SOLN
10.0000 mL | Freq: Once | INTRAVENOUS | Status: AC | PRN
  Administered 2018-10-10: 10 mL via INTRAVENOUS

## 2018-10-10 NOTE — Consult Note (Signed)
Full note to follow.  Celluilitis left foot with bacteremia.  No skin break.  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

## 2018-10-10 NOTE — TOC Initial Note (Signed)
Transition of Care [facility]) - Initial/Assessment Note    Patient Details  Name: Damon Anderson MRN: [id number] Date of Birth: April 03, 1971  [facility] of [facility] CM/SW Contact:    Su Hilt, RN Phone Number: 10/10/2018, 3:34 PM  Clinical Narrative:                 Met with the patient to discuss DC plan and needs, he has a RW and a cane at home.  He lives alone but his ex wife helps out when needed, her name is Nunzio Cory.  He is aware that he may need IV ABX at home and is fine learning how to hang it, I sent a message to [place] with [facility]  To find out if they are INN with the insurance company.  Patient states that for wound care he would need a Home health nurse to manage the wound.  He would be willing and feels his ex wife would be willing to learn what to do for the wound care,  Pam with [facility] is aware of the potential need for IV ABX at home.  Will continue to monitor for needs .    Expected Discharge Plan: [place] Barriers to Discharge: Continued Medical Work up   Patient Goals and CMS Choice   CMS Medicare.gov Compare Post Acute Care list provided to:: Patient Choice offered to / list presented to : Patient  Expected Discharge Plan and Services Expected Discharge Plan: [place]   Discharge Planning Services: CM Consult Post Acute Care Choice: [place] arrangements for the past 2 months: Single Family Home Expected Discharge Date: 10/12/18                         HH Arranged: PT, RN          Prior Living Arrangements/Services Living arrangements for the past 2 months: Single Family Home Lives with:: Self Patient language and need for interpreter reviewed:: No Do you feel safe going back to the place where you live?: Yes      Need for Family Participation in Patient Care: No (Comment) Care giver support system in place?: Yes (comment) Current home services: DME(cane, rw,) Criminal Activity/Legal Involvement  Pertinent to Current Situation/Hospitalization: No - Comment as needed  Activities of Daily Living Home Assistive Devices/Equipment: Cane (specify quad or straight) ADL Screening (condition at time of admission) Patient's cognitive ability adequate to safely complete daily activities?: Yes Is the patient deaf or have difficulty hearing?: No Does the patient have difficulty seeing, even when wearing glasses/contacts?: No Does the patient have difficulty concentrating, remembering, or making decisions?: No Patient able to express need for assistance with ADLs?: Yes Does the patient have difficulty dressing or bathing?: No Independently performs ADLs?: Yes (appropriate for developmental age) Does the patient have difficulty walking or climbing stairs?: No Weakness of Legs: None Weakness of Arms/Hands: None  Permission Sought/Granted Permission sought to share information with : Case Manager Permission granted to share information with : Yes, Verbal Permission Granted     Permission granted to share info w AGENCY: HH agency        Emotional Assessment Appearance:: Appears stated age Attitude/Demeanor/Rapport: Engaged Affect (typically observed): Accepting Orientation: : Oriented to Self, Oriented to Place, Oriented to  Time, Oriented to Situation Alcohol / Substance Use: Not Applicable Psych Involvement: No (comment)  Admission diagnosis:  MSSA (methicillin susceptible Staphylococcus aureus) infection [A49.01] Cellulitis of left lower  extremity [T91.225] Patient Active Problem List   Diagnosis Date Noted  . MSSA bacteremia 10/09/2018   PCP:  Leafy Half, MD Pharmacy:   [id number] [place], [place] - [id number]: [phone]: [phone]    Social Determinants of Health (SDOH) Interventions    Readmission Risk Interventions No flowsheet data found.

## 2018-10-10 NOTE — Progress Notes (Signed)
Inpatient Diabetes Program Recommendations  AACE/ADA: New Consensus Statement on Inpatient Glycemic Control (2015)  Target Ranges:  Prepandial:   less than 140 mg/dL      Peak postprandial:   less than 180 mg/dL (1-2 hours)      Critically ill patients:  140 - 180 mg/dL   Lab Results  Component Value Date   GLUCAP 123 (H) 10/10/2018   HGBA1C 8.1 (H) 10/09/2018    Review of Glycemic Control Results for FRANCISZEK, PLATTEN (MRN [id number]) as of 10/10/2018 10:36  Ref. Range 10/09/2018 22:29 10/10/2018 02:27 10/10/2018 08:23  Glucose-Capillary Latest Ref Range: 70 - 99 mg/dL 86 123 (H) 123 (H)   Diabetes history: Type 1 DM Outpatient Diabetes medications:  He has a Medtronic Paradigm 722 insulin pump and a FreeStyle Libre Flash glucose sensor. His devices were downloaded and reviewed.  Basal rates 12 am 2.35 units/hr 4 am 1.7 units/hr 12 pm 2.5 units/hr 24-hr basal = 53 units  Bolus settings I:C ratio 1:4 Sensitivity 15 Target at 12 AM 90-100, at 9 PM 100-120 Active insulin time = 5 hrs  Current orders for Inpatient glycemic control:  Insulin pump order set  Inpatient Diabetes Program Recommendations:    Blood sugars currently well controlled with insulin pump and A1C also improved.  No recommendations.   Thanks  Adah Perl, RN, BC-ADM Inpatient Diabetes Coordinator Pager [phone] (8a-5p)

## 2018-10-10 NOTE — Progress Notes (Signed)
   Date of Admission:  10/09/2018      Subjective: stressed due to his current condition- tearful  Medications:  . aspirin EC  325 mg Oral Daily  . enoxaparin (LOVENOX) injection  40 mg Subcutaneous Q24H  . lisinopril  10 mg Oral Daily   And  . hydrochlorothiazide  12.5 mg Oral Daily  . insulin pump   Subcutaneous TID AC, HS, 0200  . pneumococcal 23 valent vaccine  0.5 mL Intramuscular Tomorrow-1000  . rosuvastatin  40 mg Oral Daily    Objective: Vital signs in last 24 hours: Temp:  [97.7 F (36.5 C)-99.5 F (37.5 C)] 97.7 F (36.5 C) (06/16 0620) Pulse Rate:  [89-102] 89 (06/16 0620) Resp:  [13-18] 18 (06/16 0620) BP: (122-156)/(74-87) 127/78 (06/16 0620) SpO2:  [96 %-99 %] 99 % (06/16 0620) Weight:  [104.3 kg] 104.3 kg (06/16 0620)  PHYSICAL EXAM:  General: Alert, cooperative, in some emotional distress distress,  Extremities: left foot- dorsum- erythematous area  Warm, mildly fluctuant Skin: b/l excoriations, scars leg Lymph: Cervical, supraclavicular normal. Neurologic: Grossly non-focal  Lab Results Recent Labs    10/09/18 1440 10/10/18 0523  WBC 8.7 8.1  HGB 13.7 13.1  HCT 39.4 37.4*  NA 137 143  K 3.8 3.5  CL 104 110  CO2 22 23  BUN 25* 19  CREATININE 1.10 1.02   Liver Panel Recent Labs    10/08/18 0508  PROT 7.2  ALBUMIN 4.2  AST 18  ALT 23  ALKPHOS 100  BILITOT 1.3*   Sedimentation Rate No results for input(s): ESRSEDRATE in the last 72 hours. C-Reactive Protein No results for input(s): CRP in the last 72 hours.  Microbiology:  Studies/Results: No results found.   Assessment/Plan: Staph aureus bacteremia: Likely source left foot. On nafcillin. We will repeat blood cultures until clear of bacteria. Appreciate Dr.Fowler's opinion MRI done -  May need TEE   Osteogenesis imperfecta with history of fractures in the past  Diabetes mellitus on insulin  pump.  Last hemoglobin A1c was 9  Hyperlipidemia on  rosuvastatin  Discussed the management with the patient

## 2018-10-10 NOTE — Progress Notes (Signed)
[employer] - [place] [facility]                                                                                                                                                                                  Patient Demographics   Bobbye CharlestonRobert Ledgerwood, is a 48 y.o. male, DOB - 11/25/1970, ZOX:[id number]  Admit date - 10/09/2018   Admitting Physician Milagros LollSrikar Sudini, MD  Outpatient Primary MD for the patient is Hadler, Crissie ReeseNortin M, MD   LOS - 1  Subjective: Patient admitted after having positive blood cultures day before in the ER currently states that he does have some pain in the left foot    Review of Systems:   CONSTITUTIONAL: No documented fever. No fatigue, weakness. No weight gain, no weight loss.  EYES: No blurry or double vision.  ENT: No tinnitus. No postnasal drip. No redness of the oropharynx.  RESPIRATORY: No cough, no wheeze, no hemoptysis. No dyspnea.  CARDIOVASCULAR: No chest pain. No orthopnea. No palpitations. No syncope.  GASTROINTESTINAL: No nausea, no vomiting or diarrhea. No abdominal pain. No melena or hematochezia.  GENITOURINARY: No dysuria or hematuria.  ENDOCRINE: No polyuria or nocturia. No heat or cold intolerance.  HEMATOLOGY: No anemia. No bruising. No bleeding.  INTEGUMENTARY: No rashes. No lesions.  MUSCULOSKELETAL: No arthritis. No swelling. No gout.  Left foot pain NEUROLOGIC: No numbness, tingling, or ataxia. No seizure-type activity.  PSYCHIATRIC: No anxiety. No insomnia. No ADD.    Vitals:   Vitals:   10/09/18 1619 10/09/18 1735 10/09/18 2041 10/10/18 0620  BP: 122/74 129/77 (!) 156/87 127/78  Pulse: 96 95 (!) 102 89  Resp: 13 16 18 18   Temp:  99.5 F (37.5 C) 98.8 F (37.1 C) 97.7 F (36.5 C)  TempSrc:  Oral Oral Oral  SpO2: 98% 99% 96% 99%  Weight:    104.3 kg  Height:        Wt Readings from Last 3 Encounters:  10/10/18 104.3 kg  10/08/18 104.3 kg     Intake/Output Summary (Last 24 hours) at 10/10/2018 1150 Last  data filed at 10/10/2018 0954 Gross per 24 hour  Intake 660.17 ml  Output -  Net 660.17 ml    Physical Exam:   GENERAL: Pleasant-appearing in no apparent distress.  HEAD, EYES, EARS, NOSE AND THROAT: Atraumatic, normocephalic. Extraocular muscles are intact. Pupils equal and reactive to light. Sclerae anicteric. No conjunctival injection. No oro-pharyngeal erythema.  NECK: Supple. There is no jugular venous distention. No bruits, no lymphadenopathy, no thyromegaly.  HEART: Regular rate and rhythm,. No murmurs, no rubs, no clicks.  LUNGS: Clear to auscultation bilaterally. No rales or rhonchi. No wheezes.  ABDOMEN:  Soft, flat, nontender, nondistended. Has good bowel sounds. No hepatosplenomegaly appreciated.  EXTREMITIES: No evidence of any cyanosis, clubbing, or peripheral edema.  +2 pedal and radial pulses bilaterally.  Left foot there is discoloration and warm to touch NEUROLOGIC: The patient is alert, awake, and oriented x3 with no focal motor or sensory deficits appreciated bilaterally.  SKIN: Moist and warm with no rashes appreciated.  Psych: Not anxious, depressed LN: No inguinal LN enlargement    Antibiotics   Anti-infectives (From admission, onward)   Start     Dose/Rate Route Frequency Ordered Stop   10/09/18 2100  nafcillin 2 g in sodium chloride 0.9 % 100 mL IVPB     2 g 200 mL/hr over 30 Minutes Intravenous Every 4 hours 10/09/18 2030     10/09/18 1930  nafcillin injection 2 g  Status:  Discontinued     2 g Intravenous Every 4 hours 10/09/18 1918 10/09/18 2030   10/09/18 1430  nafcillin 2 g in sodium chloride 0.9 % 100 mL IVPB     2 g 200 mL/hr over 30 Minutes Intravenous  Once 10/09/18 1416 10/09/18 1626      Medications   Scheduled Meds: . aspirin EC  325 mg Oral Daily  . enoxaparin (LOVENOX) injection  40 mg Subcutaneous Q24H  . lisinopril  10 mg Oral Daily   And  . hydrochlorothiazide  12.5 mg Oral Daily  . insulin pump   Subcutaneous TID AC, HS, 0200  .  pneumococcal 23 valent vaccine  0.5 mL Intramuscular Tomorrow-1000  . rosuvastatin  40 mg Oral Daily   Continuous Infusions: . nafcillin IV 2 g (10/10/18 0856)   PRN Meds:.acetaminophen **OR** acetaminophen, albuterol, ibuprofen, ondansetron **OR** ondansetron (ZOFRAN) IV, polyethylene glycol   Data Review:   Micro Results Recent Results (from the past 240 hour(s))  Blood Culture (routine x 2)     Status: None (Preliminary result)   Collection Time: 10/08/18  9:46 AM   Specimen: BLOOD  Result Value Ref Range Status   Specimen Description   Final    BLOOD L HAND Performed at [facility], [address], [place], [place] [address]    Special Requests   Final    BOTTLES DRAWN AEROBIC AND ANAEROBIC Blood Culture adequate volume Performed at [facility], [address] [place], [place] [address]    Culture  Setup Time   Final    AEROBIC BOTTLE ONLY GRAM POSITIVE COCCI CRITICAL RESULT CALLED TO, READ BACK BY AND VERIFIED WITH: LISA CLUTTZ AT 0410 ON 10/09/2018 JJB Performed at [facility], [address], [place], [place] [address]    Culture GRAM POSITIVE COCCI  Final   Report Status PENDING  Incomplete  Blood Culture (routine x 2)     Status: Abnormal (Preliminary result)   Collection Time: 10/08/18  9:46 AM   Specimen: BLOOD  Result Value Ref Range Status   Specimen Description   Final    BLOOD LAC Performed at [facility], [address] [place], [place] [address]    Special Requests   Final    BOTTLES DRAWN AEROBIC AND ANAEROBIC Blood Culture adequate volume Performed at [facility], [address] [place], [place] [address]    Culture  Setup Time   Final    AEROBIC BOTTLE ONLY GRAM POSITIVE COCCI CRITICAL RESULT CALLED TO, READ BACK BY AND VERIFIED WITH: LISA CLUTTZ AT 0410 ON 10/09/2018 JJB Performed at [facility], [address]  [place], [place] [address]    Culture STAPHYLOCOCCUS AUREUS (A)  Final    Report Status PENDING  Incomplete  Blood Culture ID Panel (Reflexed)     Status: Abnormal   Collection Time: 10/08/18  9:46 AM  Result Value Ref Range Status   Enterococcus species NOT DETECTED NOT DETECTED Final   Listeria monocytogenes NOT DETECTED NOT DETECTED Final   Staphylococcus species DETECTED (A) NOT DETECTED Final    Comment: CRITICAL RESULT CALLED TO, READ BACK BY AND VERIFIED WITH: LISA CLUTTZ AT 0410 ON 10/09/2018 JJB    Staphylococcus aureus (BCID) DETECTED (A) NOT DETECTED Final    Comment: Methicillin (oxacillin) susceptible Staphylococcus aureus (MSSA). Preferred therapy is anti staphylococcal beta lactam antibiotic (Cefazolin or Nafcillin), unless clinically contraindicated. CRITICAL RESULT CALLED TO, READ BACK BY AND VERIFIED WITH: LISA CLUTTZ AT 0410 ON 10/09/2018 JJB    Methicillin resistance NOT DETECTED NOT DETECTED Final   Streptococcus species NOT DETECTED NOT DETECTED Final   Streptococcus agalactiae NOT DETECTED NOT DETECTED Final   Streptococcus pneumoniae NOT DETECTED NOT DETECTED Final   Streptococcus pyogenes NOT DETECTED NOT DETECTED Final   Acinetobacter baumannii NOT DETECTED NOT DETECTED Final   Enterobacteriaceae species NOT DETECTED NOT DETECTED Final   Enterobacter cloacae complex NOT DETECTED NOT DETECTED Final   Escherichia coli NOT DETECTED NOT DETECTED Final   Klebsiella oxytoca NOT DETECTED NOT DETECTED Final   Klebsiella pneumoniae NOT DETECTED NOT DETECTED Final   Proteus species NOT DETECTED NOT DETECTED Final   Serratia marcescens NOT DETECTED NOT DETECTED Final   Haemophilus influenzae NOT DETECTED NOT DETECTED Final   Neisseria meningitidis NOT DETECTED NOT DETECTED Final   Pseudomonas aeruginosa NOT DETECTED NOT DETECTED Final   Candida albicans NOT DETECTED NOT DETECTED Final   Candida glabrata NOT DETECTED NOT DETECTED Final   Candida krusei NOT DETECTED NOT DETECTED Final   Candida parapsilosis NOT DETECTED NOT DETECTED Final    Candida tropicalis NOT DETECTED NOT DETECTED Final    Comment: Performed at [facility], [address] [place], [place] [address]  Novel Coronavirus,NAA,(SEND-OUT TO REF LAB - TAT 24-48 hrs); Hosp Order     Status: None   Collection Time: 10/09/18  2:40 PM   Specimen: Nasopharyngeal Swab; Respiratory  Result Value Ref Range Status   SARS-CoV-2, NAA NOT DETECTED NOT DETECTED Final    Comment: (NOTE) This test was developed and its performance characteristics determined by [employer]. This test has not been FDA cleared or approved. This test has been authorized by FDA under an Emergency Use Authorization (EUA). This test is only authorized for the duration of time the declaration that circumstances exist justifying the authorization of the emergency use of in vitro diagnostic tests for detection of SARS-CoV-2 virus and/or diagnosis of COVID-19 infection under section 564(b)(1) of the Act, 21 U.S.C. 106YIR-4(W)(5), unless the authorization is terminated or revoked sooner. When diagnostic testing is negative, the possibility of a false negative result should be considered in the context of a patient's recent exposures and the presence of clinical signs and symptoms consistent with COVID-19. An individual without symptoms of COVID-19 and who is not shedding SARS-CoV-2 virus would expect to have a negative (not detected) result in this assay. Performed  At: [facility] [place], [place] [id number] Rush Farmer MD XF:[id number]    [place]  Final    Comment: Performed at [facility], [address] [place], [place] [address]  Blood Culture (routine x 2)  Status: None (Preliminary result)   Collection Time: 10/09/18  2:40 PM   Specimen: BLOOD  Result Value Ref Range Status   Specimen Description BLOOD RIGHT ANTECUBITAL  Final   Special Requests   Final    BOTTLES DRAWN AEROBIC AND ANAEROBIC Blood  Culture results may not be optimal due to an excessive volume of blood received in culture bottles   Culture   Final    NO GROWTH < 12 HOURS Performed at [facility], [address] [place], [place] [address]    Report Status PENDING  Incomplete  Blood Culture (routine x 2)     Status: None (Preliminary result)   Collection Time: 10/09/18  2:40 PM   Specimen: BLOOD  Result Value Ref Range Status   Specimen Description BLOOD BLOOD RIGHT HAND  Final   Special Requests   Final    BOTTLES DRAWN AEROBIC AND ANAEROBIC Blood Culture adequate volume   Culture   Final    NO GROWTH < 12 HOURS Performed at [facility], [address] [place], [place] [address]    Report Status PENDING  Incomplete    Radiology Reports Dg Foot Complete Left  Result Date: 10/08/2018 CLINICAL DATA:  Left foot pain and erythema. Osteogenesis imperfecta. EXAM: LEFT FOOT - COMPLETE 3+ VIEW COMPARISON:  None. FINDINGS: There is no evidence of acute fracture or dislocation. Generalized osteopenia is noted. No focal lytic or sclerotic bone lesions identified. Degenerative spurring and chronic deformity of articular surface of 2nd metatarsal head noted. Mild soft tissue swelling is seen along the dorsal aspect of the distal metacarpals. No evidence of soft tissue gas or radiopaque foreign body. IMPRESSION: Mild dorsal soft tissue swelling. No acute osseous abnormality. Electronically Signed   By: Myles RosenthalJohn  Stahl M.D.   On: 10/08/2018 06:43     CBC Recent Labs  Lab 10/08/18 0508 10/09/18 1440 10/10/18 0523  WBC 11.8* 8.7 8.1  HGB 14.4 13.7 13.1  HCT 42.0 39.4 37.4*  PLT 291 289 299  MCV 89.6 88.5 88.0  MCH 30.7 30.8 30.8  MCHC 34.3 34.8 35.0  RDW 12.5 12.5 12.4  LYMPHSABS 1.6 1.0  --   MONOABS 1.3* 0.8  --   EOSABS 0.1 0.1  --   BASOSABS 0.1 0.0  --     Chemistries  Recent Labs  Lab 10/08/18 0508 10/09/18 1440 10/10/18 0523  NA 137 137 143  K 4.0 3.8 3.5  CL 105 104 110  CO2 18* 22  23  GLUCOSE 309* 228* 98  BUN 25* 25* 19  CREATININE 1.10 1.10 1.02  CALCIUM 8.4* 8.5* 8.5*  AST 18  --   --   ALT 23  --   --   ALKPHOS 100  --   --   BILITOT 1.3*  --   --    ------------------------------------------------------------------------------------------------------------------ estimated creatinine clearance is 101.5 mL/min (by C-G formula based on SCr of 1.02 mg/dL). ------------------------------------------------------------------------------------------------------------------ Recent Labs    10/09/18 1715  HGBA1C 8.1*   ------------------------------------------------------------------------------------------------------------------ No results for input(s): CHOL, HDL, LDLCALC, TRIG, CHOLHDL, LDLDIRECT in the last 72 hours. ------------------------------------------------------------------------------------------------------------------ No results for input(s): TSH, T4TOTAL, T3FREE, THYROIDAB in the last 72 hours.  Invalid input(s): FREET3 ------------------------------------------------------------------------------------------------------------------ No results for input(s): VITAMINB12, FOLATE, FERRITIN, TIBC, IRON, RETICCTPCT in the last 72 hours.  Coagulation profile No results for input(s): INR, PROTIME in the last 168 hours.  No results for input(s): DDIMER in the last 72 hours.  Cardiac Enzymes No results for input(s): CKMB, TROPONINI,  MYOGLOBIN in the last 168 hours.  Invalid input(s): CK ------------------------------------------------------------------------------------------------------------------ Invalid input(s): POCBNP    Assessment & Plan  *MSSA bacteremia secondary to left foot cellulitis.  Continue IV nafcillin Repeat blood cultures from yesterday show no growth ID MD is following  *Diabetes mellitus.  Continue patient's home insulin pump.    *Hypertension.  Continue lisinopril and HCTZ   *Hyperlipidemia continue Crestor  *DVT  prophylaxis with Lovenox     Code Status Orders  (From admission, onward)         Start     Ordered   10/09/18 1513  Full code  Continuous     10/09/18 1514        Code Status History    This patient has a current code status but no historical code status.   Advance Care Planning Activity           Consults podiatry infectious disease  DVT Prophylaxis  Lovenox  Lab Results  Component Value Date   PLT 299 10/10/2018     Time Spent in minutes   35 minutes  Greater than 50% of time spent in care coordination and counseling patient regarding the condition and plan of care.   Auburn BilberryShreyang Arian Mcquitty M.D on 10/10/2018 at 11:50 AM  Between 7am to 6pm - Pager - [phone]  After 6pm go to [email or web address] - Social research officer, governmentpassword [email or web address]  [employer]   Office  [phone]

## 2018-10-10 NOTE — Consult Note (Signed)
ORTHOPAEDIC CONSULTATION  REQUESTING PHYSICIAN: Auburn BilberryPatel, Shreyang, MD  Chief Complaint: Left foot infection.  He was seen in the ER and was diagnosed with cellulitis of his left foot.  Discharged at that time but blood cultures eventually came back with MSSA bacteremia.  Brought back to the hospital for IV antibiotics and reevaluation.  HPI: Damon Anderson is a 48 y.o. male who complains of series chief complaint  History of diabetes and osteogenesis imperfecta.  Patient states he has a worsening symptoms of neuropathy to his feet bilaterally.  Social History   Socioeconomic History  . Marital status: Married    Spouse name: Not on file  . Number of children: Not on file  . Years of education: Not on file  . Highest education level: Not on file  Occupational History  . Not on file  Social Needs  . Financial resource strain: Not on file  . Food insecurity    Worry: Not on file    Inability: Not on file  . Transportation needs    Medical: Not on file    Non-medical: Not on file  Tobacco Use  . Smoking status: Unknown If Ever Smoked  Substance and Sexual Activity  . Alcohol use: Not on file  . Drug use: Not on file  . Sexual activity: Not on file  Lifestyle  . Physical activity    Days per week: Not on file    Minutes per session: Not on file  . Stress: Not on file  Relationships  . Social Musicianconnections    Talks on phone: Not on file    Gets together: Not on file    Attends religious service: Not on file    Active member of club or organization: Not on file    Attends meetings of clubs or organizations: Not on file    Relationship status: Not on file  Other Topics Concern  . Not on file  Social History Narrative  . Not on file   Family History  Problem Relation Age of Onset  . Diabetes Mother   . Hypertension Father    Allergies  Allergen Reactions  . Demerol [Meperidine Hcl] Nausea And Vomiting   Prior to Admission medications   Medication Sig Start Date End  Date Taking? Authorizing Provider  aspirin 325 MG tablet Take 325 mg by mouth daily.   Yes [provider]  cetirizine (ZYRTEC) 10 MG tablet Take 10 mg by mouth daily.   Yes [provider]  clindamycin (CLEOCIN) 300 MG capsule Take 1 capsule (300 mg total) by mouth 3 (three) times daily. 10/08/18  Yes Sharyn CreamerQuale, Mark, MD  insulin aspart (NOVOLOG) 100 UNIT/ML injection Inject 120 Units into the skin as directed.  01/20/18  Yes [provider]  insulin glargine (LANTUS) 100 UNIT/ML injection Inject 56 Units into the skin daily as needed (backup insulin therapy).  08/07/18 08/07/19 Yes [provider]  lisinopril-hydrochlorothiazide (ZESTORETIC) 10-12.5 MG tablet Take 1 tablet by mouth daily.   Yes [provider]  metFORMIN (GLUCOPHAGE-XR) 500 MG 24 hr tablet Take 500 mg by mouth daily with supper. 07/19/18 07/19/19 Yes [provider]  Multiple Vitamin (MULTIVITAMIN WITH MINERALS) TABS tablet Take 1 tablet by mouth daily.   Yes [provider]  rosuvastatin (CRESTOR) 40 MG tablet Take 40 mg by mouth daily. 07/19/18  Yes [provider]  lisinopril (ZESTRIL) 10 MG tablet Take 1 tablet (10 mg total) by mouth daily. 10/08/18   Sharyn CreamerQuale, Mark, MD   No  results found.  Positive ROS: All other systems have been reviewed and were otherwise negative with the exception of those mentioned in the HPI and as above.  12 point ROS was performed.  Physical Exam: General: Alert and oriented.  No apparent distress.  Vascular:  Left foot:Dorsalis Pedis:  present Posterior Tibial:  present  Right foot: Dorsalis Pedis:  present Posterior Tibial:  present  Neuro:absent to the digits bilaterally.  Gross sensation is intact in the mid arch region.  Derm: He has an obvious area of cellulitis that has been mapped out on the dorsal aspect of his left foot.  Seems to be most erythematous around the second third MTPJ to a lesser extent in the metatarsal areas.   There is no obvious skin breaks whatsoever.  No lymphangitic streaking at this time.  Ortho/MS: He does complain of some pain with palpation along the dorsal midfoot.  There is a palpable area of fullness around the second MTPJ.  Denies any history of trauma to the area.  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  This is concerning as comparison to previous x-rays in 2010 did not show any fracture.  May need to rule out osteomyelitis to the area.  Otherwise no areas of fluctuance or gas in the soft tissue.  Assessment: Cellulitis with MSSA bacteremia  Plan: MRI with and without contrast was ordered today for further evaluation of infection of possible abscess in the left foot.  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  We will follow-up after MRI has been performed.    Elesa Hacker, DPM Cell [phone]   10/10/2018 12:21 PM

## 2018-10-10 NOTE — Progress Notes (Signed)
*  PRELIMINARY RESULTS* Echocardiogram 2D Echocardiogram has been performed.  Damon Anderson [name] 10/10/2018, 10:16 AM

## 2018-10-11 ENCOUNTER — Encounter
Admission: EM | Disposition: A | Discharge status: Home Health | Payer: Self-pay | Source: Home / Self Care | Attending: Specialist

## 2018-10-11 ENCOUNTER — Inpatient Hospital Stay: Payer: BC Managed Care – PPO | Admitting: Certified Registered Nurse Anesthetist

## 2018-10-11 ENCOUNTER — Encounter: Payer: Self-pay | Admitting: *Deleted

## 2018-10-11 HISTORY — PX: INCISION AND DRAINAGE: SHX5863

## 2018-10-11 LAB — GLUCOSE, CAPILLARY
Glucose-Capillary: 107 mg/dL — ABNORMAL HIGH (ref 70–99)
Glucose-Capillary: 110 mg/dL — ABNORMAL HIGH (ref 70–99)
Glucose-Capillary: 141 mg/dL — ABNORMAL HIGH (ref 70–99)
Glucose-Capillary: 155 mg/dL — ABNORMAL HIGH (ref 70–99)
Glucose-Capillary: 168 mg/dL — ABNORMAL HIGH (ref 70–99)
Glucose-Capillary: 212 mg/dL — ABNORMAL HIGH (ref 70–99)
Glucose-Capillary: 225 mg/dL — ABNORMAL HIGH (ref 70–99)
Glucose-Capillary: 241 mg/dL — ABNORMAL HIGH (ref 70–99)
Glucose-Capillary: 273 mg/dL — ABNORMAL HIGH (ref 70–99)
Glucose-Capillary: 38 mg/dL — CL (ref 70–99)
Glucose-Capillary: 46 mg/dL — ABNORMAL LOW (ref 70–99)
Glucose-Capillary: 52 mg/dL — ABNORMAL LOW (ref 70–99)
Glucose-Capillary: 82 mg/dL (ref 70–99)

## 2018-10-11 LAB — HIV ANTIBODY (ROUTINE TESTING W REFLEX): HIV Screen 4th Generation wRfx: NONREACTIVE

## 2018-10-11 SURGERY — INCISION AND DRAINAGE
Anesthesia: General | Site: Second Toe | Laterality: Left

## 2018-10-11 MED ORDER — LIDOCAINE HCL (PF) 1 % IJ SOLN
INTRAMUSCULAR | Status: AC
  Filled 2018-10-11: qty 30

## 2018-10-11 MED ORDER — ONDANSETRON HCL 4 MG/2ML IJ SOLN
INTRAMUSCULAR | Status: AC
  Filled 2018-10-11: qty 2

## 2018-10-11 MED ORDER — BUPIVACAINE HCL 0.5 % IJ SOLN
INTRAMUSCULAR | Status: DC | PRN
  Administered 2018-10-11: 4 mL

## 2018-10-11 MED ORDER — SODIUM CHLORIDE 0.9 % IV SOLN
2.0000 g | INTRAVENOUS | Status: DC
  Administered 2018-10-11 – 2018-10-13 (×10): 2 g via INTRAVENOUS
  Filled 2018-10-11: qty 2
  Filled 2018-10-11 (×4): qty 2000
  Filled 2018-10-11: qty 2
  Filled 2018-10-11 (×6): qty 2000
  Filled 2018-10-11 (×2): qty 2

## 2018-10-11 MED ORDER — SODIUM CHLORIDE 0.9 % IV SOLN
INTRAVENOUS | Status: DC | PRN
  Administered 2018-10-11: 12:00:00 via INTRAVENOUS

## 2018-10-11 MED ORDER — LIDOCAINE HCL (CARDIAC) PF 100 MG/5ML IV SOSY
PREFILLED_SYRINGE | INTRAVENOUS | Status: DC | PRN
  Administered 2018-10-11: 100 mg via INTRAVENOUS

## 2018-10-11 MED ORDER — LIDOCAINE HCL (PF) 2 % IJ SOLN
INTRAMUSCULAR | Status: AC
  Filled 2018-10-11: qty 10

## 2018-10-11 MED ORDER — MIDAZOLAM HCL 2 MG/2ML IJ SOLN
INTRAMUSCULAR | Status: AC
  Filled 2018-10-11: qty 2

## 2018-10-11 MED ORDER — PROPOFOL 500 MG/50ML IV EMUL
INTRAVENOUS | Status: AC
  Filled 2018-10-11: qty 50

## 2018-10-11 MED ORDER — FENTANYL CITRATE (PF) 100 MCG/2ML IJ SOLN
INTRAMUSCULAR | Status: DC | PRN
  Administered 2018-10-11 (×2): 50 ug via INTRAVENOUS

## 2018-10-11 MED ORDER — FENTANYL CITRATE (PF) 100 MCG/2ML IJ SOLN
INTRAMUSCULAR | Status: AC
  Filled 2018-10-11: qty 2

## 2018-10-11 MED ORDER — SODIUM CHLORIDE FLUSH 0.9 % IV SOLN
INTRAVENOUS | Status: AC
  Filled 2018-10-11: qty 10

## 2018-10-11 MED ORDER — BUPIVACAINE HCL (PF) 0.25 % IJ SOLN
INTRAMUSCULAR | Status: AC
  Filled 2018-10-11: qty 30

## 2018-10-11 MED ORDER — ONDANSETRON HCL 4 MG/2ML IJ SOLN
INTRAMUSCULAR | Status: DC | PRN
  Administered 2018-10-11: 4 mg via INTRAVENOUS

## 2018-10-11 MED ORDER — OXYCODONE-ACETAMINOPHEN 5-325 MG PO TABS: 1.0000 | ORAL_TABLET | Freq: Four times a day (QID) | ORAL | Status: DC | PRN

## 2018-10-11 MED ORDER — PROPOFOL 10 MG/ML IV BOLUS
INTRAVENOUS | Status: DC | PRN
  Administered 2018-10-11: 200 mg via INTRAVENOUS

## 2018-10-11 MED ORDER — LIDOCAINE-EPINEPHRINE 1 %-1:100000 IJ SOLN
INTRAMUSCULAR | Status: AC
  Filled 2018-10-11: qty 1

## 2018-10-11 MED ORDER — PHENYLEPHRINE HCL (PRESSORS) 10 MG/ML IV SOLN
INTRAVENOUS | Status: DC | PRN
  Administered 2018-10-11 (×3): 100 ug via INTRAVENOUS

## 2018-10-11 MED ORDER — MIDAZOLAM HCL 2 MG/2ML IJ SOLN
INTRAMUSCULAR | Status: DC | PRN
  Administered 2018-10-11: 2 mg via INTRAVENOUS

## 2018-10-11 MED ORDER — LIDOCAINE-EPINEPHRINE 1 %-1:100000 IJ SOLN
INTRAMUSCULAR | Status: DC | PRN
  Administered 2018-10-11: 4 mL

## 2018-10-11 MED ORDER — METHYLENE BLUE 0.5 % INJ SOLN
INTRAVENOUS | Status: AC
  Filled 2018-10-11: qty 10

## 2018-10-11 MED ORDER — FENTANYL CITRATE (PF) 100 MCG/2ML IJ SOLN: 25.0000 ug | INTRAMUSCULAR | Status: DC | PRN

## 2018-10-11 MED ORDER — BUPIVACAINE HCL (PF) 0.5 % IJ SOLN
INTRAMUSCULAR | Status: AC
  Filled 2018-10-11: qty 30

## 2018-10-11 MED ORDER — ONDANSETRON HCL 4 MG/2ML IJ SOLN: 4.0000 mg | Freq: Once | INTRAMUSCULAR | Status: DC | PRN

## 2018-10-11 SURGICAL SUPPLY — 64 items
BANDAGE ACE 4X5 VEL STRL LF (GAUZE/BANDAGES/DRESSINGS) ×2 IMPLANT
BLADE OSC/SAGITTAL MD 5.5X18 (BLADE) IMPLANT
BLADE OSCILLATING/SAGITTAL (BLADE)
BLADE SURG 15 STRL LF DISP TIS (BLADE) ×1 IMPLANT
BLADE SURG 15 STRL SS (BLADE) ×1
BLADE SW THK.38XMED LNG THN (BLADE) IMPLANT
BNDG COHESIVE 4X5 TAN STRL (GAUZE/BANDAGES/DRESSINGS) ×2 IMPLANT
BNDG COHESIVE 6X5 TAN STRL LF (GAUZE/BANDAGES/DRESSINGS) ×2 IMPLANT
BNDG CONFORM 2 STRL LF (GAUZE/BANDAGES/DRESSINGS) ×2 IMPLANT
BNDG CONFORM 3 STRL LF (GAUZE/BANDAGES/DRESSINGS) ×2 IMPLANT
BNDG ESMARK 4X12 TAN STRL LF (GAUZE/BANDAGES/DRESSINGS) ×2 IMPLANT
BNDG GAUZE 4.5X4.1 6PLY STRL (MISCELLANEOUS) ×2 IMPLANT
CANISTER SUCT 1200ML W/VALVE (MISCELLANEOUS) ×2 IMPLANT
CANISTER SUCT 3000ML PPV (MISCELLANEOUS) ×2 IMPLANT
CANISTER WOUND CARE 500ML ATS (WOUND CARE) ×2 IMPLANT
COVER WAND RF STERILE (DRAPES) ×2 IMPLANT
CUFF TOURN SGL QUICK 12 (TOURNIQUET CUFF) IMPLANT
CUFF TOURN SGL QUICK 18X4 (TOURNIQUET CUFF) IMPLANT
DRAPE FLUOR MINI C-ARM 54X84 (DRAPES) IMPLANT
DRAPE XRAY CASSETTE 23X24 (DRAPES) IMPLANT
DRESSING ALLEVYN 4X4 (MISCELLANEOUS) IMPLANT
DURAPREP 26ML APPLICATOR (WOUND CARE) ×2 IMPLANT
ELECT REM PT RETURN 9FT ADLT (ELECTROSURGICAL) ×2
ELECTRODE REM PT RTRN 9FT ADLT (ELECTROSURGICAL) ×1 IMPLANT
GAUZE PACKING 1/4 X5 YD (GAUZE/BANDAGES/DRESSINGS) ×2 IMPLANT
GAUZE PACKING IODOFORM 1X5 (MISCELLANEOUS) ×2 IMPLANT
GAUZE SPONGE 4X4 12PLY STRL (GAUZE/BANDAGES/DRESSINGS) ×2 IMPLANT
GAUZE XEROFORM 1X8 LF (GAUZE/BANDAGES/DRESSINGS) ×2 IMPLANT
GLOVE BIO SURGEON STRL SZ7.5 (GLOVE) ×2 IMPLANT
GLOVE INDICATOR 8.0 STRL GRN (GLOVE) ×2 IMPLANT
GOWN STRL REUS W/ TWL LRG LVL3 (GOWN DISPOSABLE) ×2 IMPLANT
GOWN STRL REUS W/TWL LRG LVL3 (GOWN DISPOSABLE) ×2
GOWN STRL REUS W/TWL MED LVL3 (GOWN DISPOSABLE) ×2 IMPLANT
HANDPIECE VERSAJET DEBRIDEMENT (MISCELLANEOUS) IMPLANT
IV NS 1000ML (IV SOLUTION) ×1
IV NS 1000ML BAXH (IV SOLUTION) ×1 IMPLANT
KIT DRSG VAC SLVR GRANUFM (MISCELLANEOUS) ×2 IMPLANT
KIT TURNOVER KIT A (KITS) ×2 IMPLANT
LABEL OR SOLS (LABEL) ×2 IMPLANT
NEEDLE FILTER BLUNT 18X 1/2SAF (NEEDLE) ×1
NEEDLE FILTER BLUNT 18X1 1/2 (NEEDLE) ×1 IMPLANT
NEEDLE HYPO 25X1 1.5 SAFETY (NEEDLE) ×2 IMPLANT
NS IRRIG 500ML POUR BTL (IV SOLUTION) ×2 IMPLANT
PACK EXTREMITY ARMC (MISCELLANEOUS) ×2 IMPLANT
PAD ABD DERMACEA PRESS 5X9 (GAUZE/BANDAGES/DRESSINGS) ×2 IMPLANT
PENCIL ELECTRO HAND CTR (MISCELLANEOUS) ×2 IMPLANT
PULSAVAC PLUS IRRIG FAN TIP (DISPOSABLE) ×2
RASP SM TEAR CROSS CUT (RASP) IMPLANT
SHIELD FULL FACE ANTIFOG 7M (MISCELLANEOUS) ×2 IMPLANT
SOL .9 NS 3000ML IRR  AL (IV SOLUTION) ×1
SOL .9 NS 3000ML IRR UROMATIC (IV SOLUTION) ×1 IMPLANT
STOCKINETTE IMPERVIOUS 9X36 MD (GAUZE/BANDAGES/DRESSINGS) ×2 IMPLANT
SUT ETHILON 2 0 FS 18 (SUTURE) ×4 IMPLANT
SUT ETHILON 4-0 (SUTURE) ×1
SUT ETHILON 4-0 FS2 18XMFL BLK (SUTURE) ×1
SUT VIC AB 3-0 SH 27 (SUTURE) ×1
SUT VIC AB 3-0 SH 27X BRD (SUTURE) ×1 IMPLANT
SUT VIC AB 4-0 FS2 27 (SUTURE) ×2 IMPLANT
SUTURE ETHLN 4-0 FS2 18XMF BLK (SUTURE) ×1 IMPLANT
SWAB CULTURE AMIES ANAERIB BLU (MISCELLANEOUS) IMPLANT
SYR 10ML LL (SYRINGE) ×2 IMPLANT
SYR 3ML LL SCALE MARK (SYRINGE) ×2 IMPLANT
TIP FAN IRRIG PULSAVAC PLUS (DISPOSABLE) ×1 IMPLANT
TRAY PREP VAG/GEN (MISCELLANEOUS) ×2 IMPLANT

## 2018-10-11 NOTE — TOC Progression Note (Addendum)
Transition of Care [facility]) - Progression Note    Patient Details  Name: Damon Anderson MRN: [id number] Date of Birth: 07/15/1970  Transition of [facility] CM/SW Contact  Su Hilt, RN Phone Number: 10/11/2018, 8:40 AM  Clinical Narrative:    [facility] for PT and nursing, to start at DC, Notified the patient that they have been accepted by [facility], The patient states that he does not want to go to rehab at all.  He states that he now may not have anyone to help at home.  I explained the only 2 options are rehab or home.  He stated that he wanted to go home for sure.  He will need wound care and PT and Nursing for PICC line care for IV ABX, he will need a RW and 3 in 1, I notified brad with [facility] of the DME need, will continue to monitor for additional needs   Expected Discharge Plan: [place] Barriers to Discharge: Continued Medical Work up  Expected Discharge Plan and Services Expected Discharge Plan: [place]   Discharge Planning Services: CM Consult Post Acute Care Choice: [place] arrangements for the past 2 months: Single Family Home Expected Discharge Date: 10/12/18                         HH Arranged: PT, RN           Social Determinants of Health (SDOH) Interventions    Readmission Risk Interventions No flowsheet data found.

## 2018-10-11 NOTE — Anesthesia Postprocedure Evaluation (Signed)
Anesthesia Post Note  Patient: Russel Morain  Procedure(s) Performed: INCISION AND DRAINAGE WITH BONE BIOPSY (Left Second Toe)  Patient location during evaluation: PACU Anesthesia Type: General Level of consciousness: awake and alert and oriented Pain management: pain level controlled Vital Signs Assessment: post-procedure vital signs reviewed and stable Respiratory status: spontaneous breathing, nonlabored ventilation and respiratory function stable Cardiovascular status: blood pressure returned to baseline and stable Postop Assessment: no signs of nausea or vomiting Anesthetic complications: no     Last Vitals:  Vitals:   10/11/18 1358 10/11/18 1413  BP: 137/74 135/76  Pulse: 85 86  Resp: (!) 8 14  Temp:  36.8 C  SpO2: 98% 96%    Last Pain:  Vitals:   10/11/18 1413  TempSrc:   PainSc: 0-No pain                 Jarmar Rousseau

## 2018-10-11 NOTE — Anesthesia Preprocedure Evaluation (Signed)
Anesthesia Evaluation  Patient identified by MRN, date of birth, ID band Patient awake    Reviewed: Allergy & Precautions, NPO status , Patient's Chart, lab work & pertinent test results  History of Anesthesia Complications Negative for: history of anesthetic complications  Airway Mallampati: II  TM Distance: >3 FB Neck ROM: Full    Dental  (+) Poor Dentition, Missing,    Pulmonary neg sleep apnea, neg COPD, former smoker,    breath sounds clear to auscultation- rhonchi (-) wheezing      Cardiovascular hypertension, Pt. on medications (-) CAD, (-) Past MI, (-) Cardiac Stents and (-) CABG  Rhythm:Regular Rate:Normal - Systolic murmurs and - Diastolic murmurs    Neuro/Psych neg Seizures negative neurological ROS  negative psych ROS   GI/Hepatic negative GI ROS, Neg liver ROS,   Endo/Other  diabetes, Type 1, Insulin Dependent  Renal/GU negative Renal ROS     Musculoskeletal negative musculoskeletal ROS (+)   Abdominal (+) + obese,   Peds  Hematology negative hematology ROS (+)   Anesthesia Other Findings Past Medical History: No date: Diabetes mellitus without complication (HCC)     Comment:  Type 1 with insulin pump No date: Hypertension   Reproductive/Obstetrics                             Anesthesia Physical Anesthesia Plan  ASA: III  Anesthesia Plan: General   Post-op Pain Management:    Induction: Intravenous  PONV Risk Score and Plan: 1 and Ondansetron and Midazolam  Airway Management Planned: LMA  Additional Equipment:   Intra-op Plan:   Post-operative Plan:   Informed Consent: I have reviewed the patients History and Physical, chart, labs and discussed the procedure including the risks, benefits and alternatives for the proposed anesthesia with the patient or authorized representative who has indicated his/her understanding and acceptance.     Dental advisory  given  Plan Discussed with: CRNA and Anesthesiologist  Anesthesia Plan Comments:         Anesthesia Quick Evaluation

## 2018-10-11 NOTE — Anesthesia Procedure Notes (Signed)
Procedure Name: LMA Insertion Date/Time: 10/11/2018 12:41 PM Performed by: Johnna Acosta, CRNA Pre-anesthesia Checklist: Patient identified, Emergency Drugs available, Suction available, Patient being monitored and Timeout performed Patient Re-evaluated:Patient Re-evaluated prior to induction Oxygen Delivery Method: Circle system utilized Preoxygenation: Pre-oxygenation with 100% oxygen Induction Type: IV induction LMA: LMA inserted LMA Size: 5.0 Tube type: Oral Number of attempts: 1 Placement Confirmation: positive ETCO2 and breath sounds checked- equal and bilateral Tube secured with: Tape Dental Injury: Teeth and Oropharynx as per pre-operative assessment

## 2018-10-11 NOTE — Progress Notes (Addendum)
[facility] - [place] at [facility]                                                                                                                                                                                  Patient Demographics   Damon Anderson, is a 48 y.o. male, DOB - 08-21-1970, ZOX:[id number]  Admit date - 10/09/2018   Admitting Physician Milagros Loll, MD  Outpatient Primary MD for the patient is Hadler, Crissie Reese, MD   LOS - 2  Subjective: Patient anxious about surgery later today He denies any pain or other symptoms  Review of Systems:   CONSTITUTIONAL: No documented fever. No fatigue, weakness. No weight gain, no weight loss.  EYES: No blurry or double vision.  ENT: No tinnitus. No postnasal drip. No redness of the oropharynx.  RESPIRATORY: No cough, no wheeze, no hemoptysis. No dyspnea.  CARDIOVASCULAR: No chest pain. No orthopnea. No palpitations. No syncope.  GASTROINTESTINAL: No nausea, no vomiting or diarrhea. No abdominal pain. No melena or hematochezia.  GENITOURINARY: No dysuria or hematuria.  ENDOCRINE: No polyuria or nocturia. No heat or cold intolerance.  HEMATOLOGY: No anemia. No bruising. No bleeding.  INTEGUMENTARY: Discoloration on his left foot MUSCULOSKELETAL: No arthritis. No swelling. No gout.  Left foot pain NEUROLOGIC: No numbness, tingling, or ataxia. No seizure-type activity.  PSYCHIATRIC: No anxiety. No insomnia. No ADD.    Vitals:   Vitals:   10/10/18 1654 10/10/18 2332 10/11/18 0300 10/11/18 0741  BP: 131/77 130/86  126/74  Pulse: 89 96  91  Resp: Temp: 99.1 F (37.3 C) 98.8 F (37.1 C)  98.4 F (36.9 C)  TempSrc: Oral Oral  Oral  SpO2: 99% 98%  98%  Weight:   104.4 kg   Height:        Wt Readings from Last 3 Encounters:  10/11/18 104.4 kg  10/08/18 104.3 kg     Intake/Output Summary (Last 24 hours) at 10/11/2018 1113 Last data filed at 10/11/2018 0300 Gross per 24 hour  Intake 912.15 ml  Output -   Net 912.15 ml    Physical Exam:   GENERAL: Pleasant-appearing in no apparent distress.  HEAD, EYES, EARS, NOSE AND THROAT: Atraumatic, normocephalic. Extraocular muscles are intact. Pupils equal and reactive to light. Sclerae anicteric. No conjunctival injection. No oro-pharyngeal erythema.  NECK: Supple. There is no jugular venous distention. No bruits, no lymphadenopathy, no thyromegaly.  HEART: Regular rate and rhythm,. No murmurs, no rubs, no clicks.  LUNGS: Clear to auscultation bilaterally. No rales or rhonchi. No wheezes.  ABDOMEN: Soft, flat, nontender, nondistended. Has good bowel sounds. No hepatosplenomegaly appreciated.  EXTREMITIES: No evidence  of any cyanosis, clubbing, or peripheral edema.  +2 pedal and radial pulses bilaterally.  Left foot there is discoloration and warm to touch NEUROLOGIC: The patient is alert, awake, and oriented x3 with no focal motor or sensory deficits appreciated bilaterally.  SKIN: Moist and warm with no rashes appreciated.  Psych: Not anxious, depressed LN: No inguinal LN enlargement    Antibiotics   Anti-infectives (From admission, onward)   Start     Dose/Rate Route Frequency Ordered Stop   10/09/18 2100  nafcillin 2 g in sodium chloride 0.9 % 100 mL IVPB     2 g 200 mL/hr over 30 Minutes Intravenous Every 4 hours 10/09/18 2030     10/09/18 1930  nafcillin injection 2 g  Status:  Discontinued     2 g Intravenous Every 4 hours 10/09/18 1918 10/09/18 2030   10/09/18 1430  nafcillin 2 g in sodium chloride 0.9 % 100 mL IVPB     2 g 200 mL/hr over 30 Minutes Intravenous  Once 10/09/18 1416 10/09/18 1626      Medications   Scheduled Meds: . aspirin EC  325 mg Oral Daily  . enoxaparin (LOVENOX) injection  40 mg Subcutaneous Q24H  . lisinopril  10 mg Oral Daily   And  . hydrochlorothiazide  12.5 mg Oral Daily  . insulin pump   Subcutaneous TID AC, HS, 0200  . pneumococcal 23 valent vaccine  0.5 mL Intramuscular Tomorrow-1000  .  povidone-iodine  2 application Topical Once  . rosuvastatin  40 mg Oral Daily   Continuous Infusions: . nafcillin IV 2 g (10/11/18 0616)   PRN Meds:.acetaminophen **OR** acetaminophen, albuterol, ibuprofen, ondansetron **OR** ondansetron (ZOFRAN) IV, polyethylene glycol   Data Review:   Micro Results Recent Results (from the past 240 hour(s))  Blood Culture (routine x 2)     Status: Abnormal   Collection Time: 10/08/18  9:46 AM   Specimen: BLOOD  Result Value Ref Range Status   Specimen Description   Final    BLOOD L HAND Performed at [facility], [address] [place], [place] [address]    Special Requests   Final    BOTTLES DRAWN AEROBIC AND ANAEROBIC Blood Culture adequate volume Performed at [facility], [address] [place], [place] [address]    Culture  Setup Time   Final    AEROBIC BOTTLE ONLY GRAM POSITIVE COCCI CRITICAL RESULT CALLED TO, READ BACK BY AND VERIFIED WITH: LISA CLUTTZ AT 0410 ON 10/09/2018 JJB Performed at [facility], [address] [place], [place] [address]    Culture STAPHYLOCOCCUS AUREUS (A)  Final   Report Status 10/11/2018 FINAL  Final   Organism ID, Bacteria STAPHYLOCOCCUS AUREUS  Final      Susceptibility   Staphylococcus aureus - MIC*    CIPROFLOXACIN <=0.5 SENSITIVE Sensitive     ERYTHROMYCIN <=0.25 SENSITIVE Sensitive     GENTAMICIN <=0.5 SENSITIVE Sensitive     OXACILLIN 1 SENSITIVE Sensitive     TETRACYCLINE <=1 SENSITIVE Sensitive     VANCOMYCIN 1 SENSITIVE Sensitive     TRIMETH/SULFA <=10 SENSITIVE Sensitive     CLINDAMYCIN <=0.25 SENSITIVE Sensitive     RIFAMPIN <=0.5 SENSITIVE Sensitive     Inducible Clindamycin NEGATIVE Sensitive     * STAPHYLOCOCCUS AUREUS  Blood Culture (routine x 2)     Status: Abnormal   Collection Time: 10/08/18  9:46 AM   Specimen: BLOOD  Result Value Ref Range Status   Specimen Description  Final    BLOOD LAC Performed at [facility], [address] [place], [place] [address]    Special Requests   Final    BOTTLES DRAWN AEROBIC AND ANAEROBIC Blood Culture adequate volume Performed at [facility], [address] [place], [place] [address]    Culture  Setup Time   Final    AEROBIC BOTTLE ONLY GRAM POSITIVE COCCI CRITICAL RESULT CALLED TO, READ BACK BY AND VERIFIED WITH: LISA CLUTTZ AT 0410 ON 10/09/2018 JJB Performed at [facility], [address] [place], [place] [address]    Culture (A)  Final    STAPHYLOCOCCUS AUREUS SUSCEPTIBILITIES PERFORMED ON PREVIOUS CULTURE WITHIN THE LAST 5 DAYS. Performed at [facility], [address] [place], [place] [address]    Report Status 10/11/2018 FINAL  Final  Blood Culture ID Panel (Reflexed)     Status: Abnormal   Collection Time: 10/08/18  9:46 AM  Result Value Ref Range Status   Enterococcus species NOT DETECTED NOT DETECTED Final   Listeria monocytogenes NOT DETECTED NOT DETECTED Final   Staphylococcus species DETECTED (A) NOT DETECTED Final    Comment: CRITICAL RESULT CALLED TO, READ BACK BY AND VERIFIED WITH: LISA CLUTTZ AT 0410 ON 10/09/2018 JJB    Staphylococcus aureus (BCID) DETECTED (A) NOT DETECTED Final    Comment: Methicillin (oxacillin) susceptible Staphylococcus aureus (MSSA). Preferred therapy is anti staphylococcal beta lactam antibiotic (Cefazolin or Nafcillin), unless clinically contraindicated. CRITICAL RESULT CALLED TO, READ BACK BY AND VERIFIED WITH: LISA CLUTTZ AT 0410 ON 10/09/2018 JJB    Methicillin resistance NOT DETECTED NOT DETECTED Final   Streptococcus species NOT DETECTED NOT DETECTED Final   Streptococcus agalactiae NOT DETECTED NOT DETECTED Final   Streptococcus pneumoniae NOT DETECTED NOT DETECTED Final   Streptococcus pyogenes NOT DETECTED NOT DETECTED Final   Acinetobacter baumannii NOT DETECTED NOT DETECTED Final   Enterobacteriaceae species NOT DETECTED NOT DETECTED Final   Enterobacter cloacae complex NOT DETECTED NOT  DETECTED Final   Escherichia coli NOT DETECTED NOT DETECTED Final   Klebsiella oxytoca NOT DETECTED NOT DETECTED Final   Klebsiella pneumoniae NOT DETECTED NOT DETECTED Final   Proteus species NOT DETECTED NOT DETECTED Final   Serratia marcescens NOT DETECTED NOT DETECTED Final   Haemophilus influenzae NOT DETECTED NOT DETECTED Final   Neisseria meningitidis NOT DETECTED NOT DETECTED Final   Pseudomonas aeruginosa NOT DETECTED NOT DETECTED Final   Candida albicans NOT DETECTED NOT DETECTED Final   Candida glabrata NOT DETECTED NOT DETECTED Final   Candida krusei NOT DETECTED NOT DETECTED Final   Candida parapsilosis NOT DETECTED NOT DETECTED Final   Candida tropicalis NOT DETECTED NOT DETECTED Final    Comment: Performed at [facility], [address] [place], [place] [address]  Novel Coronavirus,NAA,(SEND-OUT TO REF LAB - TAT 24-48 hrs); Hosp Order     Status: None   Collection Time: 10/09/18  2:40 PM   Specimen: Nasopharyngeal Swab; Respiratory  Result Value Ref Range Status   SARS-CoV-2, NAA NOT DETECTED NOT DETECTED Final    Comment: (NOTE) This test was developed and its performance characteristics determined by [facility]. This test has not been FDA cleared or approved. This test has been authorized by FDA under an Emergency Use Authorization (EUA). This test is only authorized for the duration of time the declaration that circumstances exist justifying the authorization of the emergency use of in vitro diagnostic tests for detection of SARS-CoV-2 virus and/or diagnosis of COVID-19 infection  under section 564(b)(1) of the Act, 21 U.S.C. 409WJX-9(J)(4), unless the authorization is terminated or revoked sooner. When diagnostic testing is negative, the possibility of a false negative result should be considered in the context of a patient's recent exposures and the presence of clinical signs and symptoms consistent with COVID-19. An individual without  symptoms of COVID-19 and who is not shedding SARS-CoV-2 virus would expect to have a negative (not detected) result in this assay. Performed  At: [facility] [address] [place], [place] [address] Jolene Schimke MD YQ:[id number]    Coronavirus Source NASOPHARYNGEAL  Final    Comment: Performed at [facility], [address] [place], [place] [address]  Blood Culture (routine x 2)     Status: None (Preliminary result)   Collection Time: 10/09/18  2:40 PM   Specimen: BLOOD  Result Value Ref Range Status   Specimen Description BLOOD RIGHT ANTECUBITAL  Final   Special Requests   Final    BOTTLES DRAWN AEROBIC AND ANAEROBIC Blood Culture results may not be optimal due to an excessive volume of blood received in culture bottles   Culture   Final    NO GROWTH 2 DAYS Performed at [facility], [address] [place], [place] [address]    Report Status PENDING  Incomplete  Blood Culture (routine x 2)     Status: None (Preliminary result)   Collection Time: 10/09/18  2:40 PM   Specimen: BLOOD  Result Value Ref Range Status   Specimen Description BLOOD BLOOD RIGHT HAND  Final   Special Requests   Final    BOTTLES DRAWN AEROBIC AND ANAEROBIC Blood Culture adequate volume   Culture   Final    NO GROWTH 2 DAYS Performed at [facility], [address], [place], [place] [address]    Report Status PENDING  Incomplete  MRSA PCR Screening     Status: None   Collection Time: 10/10/18  9:41 PM   Specimen: Nasopharyngeal  Result Value Ref Range Status   MRSA by PCR NEGATIVE NEGATIVE Final    Comment:        The GeneXpert MRSA Assay (FDA approved for NASAL specimens only), is one component of a comprehensive MRSA colonization surveillance program. It is not intended to diagnose MRSA infection nor to guide or monitor treatment for MRSA infections. Performed at [facility], [address], [place], [place] [address]     Radiology  Reports Mr Foot Left W Wo Contrast  Result Date: 10/10/2018 CLINICAL DATA:  Pain and swelling of the right foot. EXAM: MRI OF THE LEFT FOREFOOT WITHOUT AND WITH CONTRAST TECHNIQUE: Multiplanar, multisequence MR imaging of the right foot was performed both before and after administration of intravenous contrast. CONTRAST:  10 cc Gadavist COMPARISON:  Radiograph 10/08/2018 FINDINGS: As demonstrated on the prior plain films there is advanced collapse of the second metatarsal head along with spurring changes. This has the appearance of Freiberg's infraction. However, there is a significant arthropathic process at the second MTP joint with a large complex joint effusion, synovitis and surrounding soft tissue enhancement. There is also abnormal T1 and T2 signal intensity in both the second metatarsal head and in the proximal phalanx. These areas subsequently show contrast enhancement. Suspect septic arthritis and osteomyelitis. The other bony structures are intact. There are changes of diffuse cellulitis and myofasciitis. No discrete drainable soft tissue abscess or pyomyositis. IMPRESSION: 1. 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 Joint aspiration is suggested. 2. Cellulitis and myofasciitis without findings for discrete drainable soft tissue abscess or pyomyositis. Electronically Signed   By: Rudie MeyerP.  Gallerani M.D.   On: 10/10/2018 17:16   Dg Foot Complete Left  Result Date: 10/08/2018 CLINICAL DATA:  Left foot pain and erythema. Osteogenesis imperfecta. EXAM: LEFT FOOT - COMPLETE 3+ VIEW COMPARISON:  None. FINDINGS: There is no evidence of acute fracture or dislocation. Generalized osteopenia is noted. No focal lytic or sclerotic bone lesions identified. Degenerative spurring and chronic deformity of articular surface of 2nd metatarsal head noted. Mild soft tissue swelling is seen  along the dorsal aspect of the distal metacarpals. No evidence of soft tissue gas or radiopaque foreign body. IMPRESSION: Mild dorsal soft tissue swelling. No acute osseous abnormality. Electronically Signed   By: Myles RosenthalJohn  Stahl M.D.   On: 10/08/2018 06:43     CBC Recent Labs  Lab 10/08/18 0508 10/09/18 1440 10/10/18 0523  WBC 11.8* 8.7 8.1  HGB 14.4 13.7 13.1  HCT 42.0 39.4 37.4*  PLT 291 289 299  MCV 89.6 88.5 88.0  MCH 30.7 30.8 30.8  MCHC 34.3 34.8 35.0  RDW 12.5 12.5 12.4  LYMPHSABS 1.6 1.0  --   MONOABS 1.3* 0.8  --   EOSABS 0.1 0.1  --   BASOSABS 0.1 0.0  --     Chemistries  Recent Labs  Lab 10/08/18 0508 10/09/18 1440 10/10/18 0523  NA 137 137 143  K 4.0 3.8 3.5  CL 105 104 110  CO2 18* 22 23  GLUCOSE 309* 228* 98  BUN 25* 25* 19  CREATININE 1.10 1.10 1.02  CALCIUM 8.4* 8.5* 8.5*  AST 18  --   --   ALT 23  --   --   ALKPHOS 100  --   --   BILITOT 1.3*  --   --    ------------------------------------------------------------------------------------------------------------------ estimated creatinine clearance is 101.5 mL/min (by C-G formula based on SCr of 1.02 mg/dL). ------------------------------------------------------------------------------------------------------------------ Recent Labs    10/09/18 1715  HGBA1C 8.1*   ------------------------------------------------------------------------------------------------------------------ No results for input(s): CHOL, HDL, LDLCALC, TRIG, CHOLHDL, LDLDIRECT in the last 72 hours. ------------------------------------------------------------------------------------------------------------------ No results for input(s): TSH, T4TOTAL, T3FREE, THYROIDAB in the last 72 hours.  Invalid input(s): FREET3 ------------------------------------------------------------------------------------------------------------------ No results for input(s): VITAMINB12, FOLATE, FERRITIN, TIBC, IRON, RETICCTPCT in the last 72  hours.  Coagulation profile No results for input(s): INR, PROTIME in the last 168 hours.  No results for input(s): DDIMER in the last 72 hours.  Cardiac Enzymes No results for input(s): CKMB, TROPONINI, MYOGLOBIN in the last 168 hours.  Invalid input(s): CK ------------------------------------------------------------------------------------------------------------------ Invalid input(s): POCBNP    Assessment & Plan  Patient is 48 year old admitted with bacteremia and cellulitis involving the left foot  *MSSA bacteremia secondary to left foot cellulitis.  Continue IV nafcillin Repeat blood cultures shows no growth MRI of the foot shows findings consistent with septic joint-patient to undergo surgery today As per ID physician may need TEE, TTE negative for endocartitis   *Diabetes mellitus.  Continue patient's home insulin pump.     Patient's blood sugars increased due to infection Hemoglobin A1c is 8.1  *Hypertension.  Blood pressure stable Continue lisinopril and HCTZ  *Hyperlipidemia continue Crestor  *DVT prophylaxis with Lovenox     Code Status Orders  (From admission, onward)         Start     Ordered   10/09/18 1513  Full code  Continuous  10/09/18 1514        Code Status History    This patient has a current code status but no historical code status.   Advance Care Planning Activity           Consults podiatry infectious disease  DVT Prophylaxis  Lovenox  Lab Results  Component Value Date   PLT 299 10/10/2018     Time Spent in minutes   35 minutes  Greater than 50% of time spent in care coordination and counseling patient regarding the condition and plan of care.   Dustin Flock M.D on 10/11/2018 at 11:13 AM  Between 7am to 6pm - Pager - [phone]  After 6pm go to [email or web address] - Proofreader  [employer] [facility]   Office  [phone]

## 2018-10-11 NOTE — Anesthesia Post-op Follow-up Note (Signed)
Anesthesia QCDR form completed.        

## 2018-10-11 NOTE — Transfer of Care (Signed)
Immediate Anesthesia Transfer of Care Note  Patient: Damon Anderson  Procedure(s) Performed: INCISION AND DRAINAGE WITH BONE BIOPSY (Left Second Toe)  Patient Location: PACU  Anesthesia Type:General  Level of Consciousness: awake, alert  and oriented  Airway & Oxygen Therapy: Patient Spontanous Breathing and Patient connected to face mask oxygen  Post-op Assessment: Report given to RN and Post -op Vital signs reviewed and stable  Post vital signs: Reviewed and stable  Last Vitals:  Vitals Value Taken Time  BP 118/82 10/11/18 1313  Temp 37.3 C 10/11/18 1313  Pulse 102 10/11/18 1313  Resp 18 10/11/18 1313  SpO2 100 % 10/11/18 1313  Vitals shown include unvalidated device data.  Last Pain:  Vitals:   10/11/18 1134  TempSrc: Temporal  PainSc: 0-No pain         Complications: No apparent anesthesia complications

## 2018-10-11 NOTE — Op Note (Signed)
Operative note   Surgeon:Sande Pickert Lawyer: None    Preop diagnosis: 1.  Septic joint second MTPJ. 2.  Possible osteomyelitis second metatarsal head    Postop diagnosis: Same    Procedure: 1.  I&D septic second MTPJ 2.  Bone biopsy second metatarsal    EBL: Minimal    Anesthesia:local and general.  Local consisted of a one-to-one mixture of 0.5% bupivacaine plain and 1% lidocaine with epinephrine infiltrated along the incision site    Hemostasis: Epinephrine infiltrated along the incision site    Specimen: Deep wound swab of second metatarsophalangeal joint fluid, bone sac metatarsal for culture, bone for pathology rule out osteomyelitis    Complications: None    Operative indications:Damon Anderson is an 48 y.o. that presents today for surgical intervention.  The risks/benefits/alternatives/complications have been discussed and consent has been given.  Preoperatively patient has had recent bacteremia with cellulitis of his left foot.  X-rays showed possible Freiberg's infarction versus pathological fracture.  MRI was concerning for septic joint and possible osteomyelitis.  I discussed I&D of the septic joint as well as biopsy of the bone of the second metatarsal head.  So the risks and benefits and consent has been given    Procedure:  Patient was brought into the OR and placed on the operating table in thesupine position. After anesthesia was obtained theleft lower extremity was prepped and draped in usual sterile fashion.  Attention was directed to the second metatarsophalangeal joint where a longitudinal cyst was performed.  Sharp and blunt dissection carried down to the capsule.  The capsule was then entered.  A small amount of serous fluid with slight yellow tinge was noted.  Deep wound culture was performed at this time.  At this time there was noted to be loose bone around the second MTPJ.  A portion of this was excised and sent for bone culture.  Further evaluation of  the second metatarsal revealed large bony exostosis with loose bone surrounding the metatarsal head.  Small amount of this was then removed with a rondure and sent for pathological examination to rule out osteomyelitis.  All areas were flushed with copious amounts of irrigation.  Closure was performed of the skin with a 4-0 nylon.  This was packed at the base with iodoform packing.  Bulky sterile dressing was applied.    Patient tolerated the procedure and anesthesia well.  Was transported from the OR to the PACU with all vital signs stable and vascular status intact. To be discharged per routine protocol.  Will follow up in approximately 1 week in the outpatient clinic.

## 2018-10-12 ENCOUNTER — Inpatient Hospital Stay: Payer: Self-pay

## 2018-10-12 ENCOUNTER — Encounter: Payer: Self-pay | Admitting: Podiatry

## 2018-10-12 LAB — GLUCOSE, CAPILLARY
Glucose-Capillary: 147 mg/dL — ABNORMAL HIGH (ref 70–99)
Glucose-Capillary: 148 mg/dL — ABNORMAL HIGH (ref 70–99)
Glucose-Capillary: 180 mg/dL — ABNORMAL HIGH (ref 70–99)
Glucose-Capillary: 183 mg/dL — ABNORMAL HIGH (ref 70–99)
Glucose-Capillary: 187 mg/dL — ABNORMAL HIGH (ref 70–99)
Glucose-Capillary: 198 mg/dL — ABNORMAL HIGH (ref 70–99)
Glucose-Capillary: 255 mg/dL — ABNORMAL HIGH (ref 70–99)
Glucose-Capillary: 263 mg/dL — ABNORMAL HIGH (ref 70–99)

## 2018-10-12 LAB — CULTURE, BLOOD (ROUTINE X 2): Special Requests: ADEQUATE

## 2018-10-12 LAB — BASIC METABOLIC PANEL
Anion gap: 10 (ref 5–15)
BUN: 17 mg/dL (ref 6–20)
CO2: 22 mmol/L (ref 22–32)
Calcium: 8.5 mg/dL — ABNORMAL LOW (ref 8.9–10.3)
Chloride: 108 mmol/L (ref 98–111)
Creatinine, Ser: 1.18 mg/dL (ref 0.61–1.24)
GFR calc Af Amer: >60 mL/min (ref >60)
GFR calc non Af Amer: >60 mL/min (ref >60)
Glucose, Bld: 208 mg/dL — ABNORMAL HIGH (ref 70–99)
Potassium: 3.6 mmol/L (ref 3.5–5.1)
Sodium: 140 mmol/L (ref 135–145)

## 2018-10-12 LAB — CBC
HCT: 37.9 % — ABNORMAL LOW (ref 39.0–52.0)
Hemoglobin: 13 g/dL (ref 13.0–17.0)
MCH: 30.7 pg (ref 26.0–34.0)
MCHC: 34.3 g/dL (ref 30.0–36.0)
MCV: 89.6 fL (ref 80.0–100.0)
Platelets: 284 10*3/uL (ref 150–400)
RBC: 4.23 MIL/uL (ref 4.22–5.81)
RDW: 12.2 % (ref 11.5–15.5)
WBC: 5.8 10*3/uL (ref 4.0–10.5)
nRBC: 0 % (ref 0.0–0.2)

## 2018-10-12 MED ORDER — INSULIN PUMP
SUBCUTANEOUS | Status: DC
  Administered 2018-10-12: 18.3 via SUBCUTANEOUS
  Administered 2018-10-12: 16.3 via SUBCUTANEOUS
  Administered 2018-10-12: 15.7 via SUBCUTANEOUS
  Administered 2018-10-13: 13.2 via SUBCUTANEOUS
  Administered 2018-10-13: 18 via SUBCUTANEOUS
  Filled 2018-10-12: qty 1

## 2018-10-12 NOTE — Progress Notes (Signed)
[facility] NAME: Damon Anderson    MR#:  [id number]  DATE OF BIRTH:  [birth year]  SUBJECTIVE:   Patient presented to the hospital due to MSSA bacteremia secondary to a left foot cellulitis/osteomyelitis.  Status post I&D of the left foot 2nd MTPJ joint and also Bone biopsy.    REVIEW OF SYSTEMS:    Review of Systems  Constitutional: Negative for chills and fever.  HENT: Negative for congestion and tinnitus.   Eyes: Negative for blurred vision and double vision.  Respiratory: Negative for cough, shortness of breath and wheezing.   Cardiovascular: Negative for chest pain, orthopnea and PND.  Gastrointestinal: Negative for abdominal pain, diarrhea, nausea and vomiting.  Genitourinary: Negative for dysuria and hematuria.  Neurological: Negative for dizziness, sensory change and focal weakness.  All other systems reviewed and are negative.   Nutrition: Heart Healthy/Carb modified Tolerating Diet: Yes Tolerating PT: Await Eval.   DRUG ALLERGIES:   Allergies  Allergen Reactions  . Demerol [Meperidine Hcl] Nausea And Vomiting    VITALS:  Blood pressure 132/69, pulse 82, temperature 97.8 F (36.6 C), temperature source Oral, resp. rate 18, height 5\' 8"  (1.727 m), weight 104.9 kg, SpO2 98 %.  PHYSICAL EXAMINATION:   Physical Exam  GENERAL:  48 y.o.-year-old patient lying in bed in no acute distress.  EYES: Pupils equal, round, reactive to light and accommodation. No scleral icterus. Extraocular muscles intact.  HEENT: Head atraumatic, normocephalic. Oropharynx and nasopharynx clear.  NECK:  Supple, no jugular venous distention. No thyroid enlargement, no tenderness.  LUNGS: Normal breath sounds bilaterally, no wheezing, rales, rhonchi. No use of accessory muscles of respiration.  CARDIOVASCULAR: S1, S2 normal. No murmurs, rubs, or gallops.  ABDOMEN: Soft, nontender, nondistended. Bowel sounds present. No organomegaly or  mass.  EXTREMITIES: No cyanosis, clubbing or edema b/l. Left foot ulcer s/p surgery with dressing in place.    NEUROLOGIC: Cranial nerves II through XII are intact. No focal Motor or sensory deficits b/l.   PSYCHIATRIC: The patient is alert and oriented x 3.  SKIN: No obvious rash, lesion, or ulcer.    LABORATORY PANEL:   CBC Recent Labs  Lab 10/12/18 0424  WBC 5.8  HGB 13.0  HCT 37.9*  PLT 284   ------------------------------------------------------------------------------------------------------------------  Chemistries  Recent Labs  Lab 10/08/18 0508  10/12/18 0424  NA 137   < > 140  K 4.0   < > 3.6  CL 105   < > 108  CO2 18*   < > 22  GLUCOSE 309*   < > 208*  BUN 25*   < > 17  CREATININE 1.10   < > 1.18  CALCIUM 8.4*   < > 8.5*  AST 18  --   --   ALT 23  --   --   ALKPHOS 100  --   --   BILITOT 1.3*  --   --    < > = values in this interval not displayed.   ------------------------------------------------------------------------------------------------------------------  Cardiac Enzymes No results for input(s): TROPONINI in the last 168 hours. ------------------------------------------------------------------------------------------------------------------  RADIOLOGY:  Mr Foot Left W Wo Contrast  Result Date: 10/10/2018 CLINICAL DATA:  Pain and swelling of the right foot. EXAM: MRI OF THE LEFT FOREFOOT WITHOUT AND WITH CONTRAST TECHNIQUE: Multiplanar, multisequence MR imaging of the right foot was performed both before and after administration of intravenous contrast. CONTRAST:  10 cc Gadavist COMPARISON:  Radiograph 10/08/2018 FINDINGS: As  demonstrated on the prior plain films there is advanced collapse of the second metatarsal head along with spurring changes. This has the appearance of Freiberg's infraction. However, there is a significant arthropathic process at the second MTP joint with a large complex joint effusion, synovitis and surrounding soft tissue  enhancement. There is also abnormal T1 and T2 signal intensity in both the second metatarsal head and in the proximal phalanx. These areas subsequently show contrast enhancement. Suspect septic arthritis and osteomyelitis. The other bony structures are intact. There are changes of diffuse cellulitis and myofasciitis. No discrete drainable soft tissue abscess or pyomyositis. IMPRESSION: 1. 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 Joint aspiration is suggested. 2. Cellulitis and myofasciitis without findings for discrete drainable soft tissue abscess or pyomyositis. Electronically Signed   By: Rudie MeyerP.  Gallerani M.D.   On: 10/10/2018 17:16   [place] Ekg Site Rite  Result Date: 10/12/2018 If Site Rite image not attached, placement could not be confirmed due to current cardiac rhythm.    ASSESSMENT AND PLAN:   48 year old male with past medical history of essential hypertension, diabetes who presented to the hospital due to MSSA bacteremia.  1.  MSSA bacteremia secondary to left foot cellulitis- the source is the left foot cellulitis/osteomyelitis. -Continue nafcillin. -Seen by infectious disease, repeat blood cultures obtained which are currently negative. - Patient will likely need long-term IV antibiotics and will place order for PICC line.  Discussed with ID.  2.  Left foot cellulitis/osteomyelitis- patient is status post surgical debridement done by podiatry.  Postop day #1 today.  Continue local wound care. - Bone culture obtained and await intraoperative wound cultures to. -Continue IV nafcillin for now.  3.  Diabetes type 2 without complication-continue insulin pump. -Continue carb controlled diet.  Blood sugar stable.  4.  Essential hypertension-continue lisinopril/HCTZ.  5.  Hyperlipidemia-continue Crestor.    All the records are reviewed and case discussed  with Care Management/Social Worker. Management plans discussed with the patient, family and they are in agreement.  CODE STATUS: Full code  DVT Prophylaxis: Lovenox  TOTAL TIME TAKING CARE OF THIS PATIENT: 30 minutes.   POSSIBLE D/C IN 2-3 DAYS, DEPENDING ON CLINICAL CONDITION.   [place] SirenVivek J Aldred Mase M.D on 10/12/2018 at 1:44 PM  Between 7am to 6pm - Pager - [phone]  After 6pm go to [email or web address] - Social research officer, governmentpassword EPAS [facility]  [facility]  Office  [phone]  CC: Primary care physician; Burnard LeighHadler, Nortin M, MD

## 2018-10-12 NOTE — Progress Notes (Signed)
Pt BG 38. Initiated hypoglycemia protocol. Suspended insulin pump. Md notified. Orders to leave pump suspended until bg >100 x 2. Goal achieved and pump resumed at 11:30pm. BG is stable. Will continue to monitor bg Q4h.

## 2018-10-12 NOTE — Progress Notes (Signed)
   Date of Admission:  10/09/2018     ID: Damon Anderson is a 48 y.o. male Active Problems:   MSSA bacteremia    Subjective: Pt seen with Dr.Fowler Doing well    Medications:  . aspirin EC  325 mg Oral Daily  . enoxaparin (LOVENOX) injection  40 mg Subcutaneous Q24H  . lisinopril  10 mg Oral Daily   And  . hydrochlorothiazide  12.5 mg Oral Daily  . insulin pump   Subcutaneous Q4H  . pneumococcal 23 valent vaccine  0.5 mL Intramuscular Tomorrow-1000  . rosuvastatin  40 mg Oral Daily    Objective: Vital signs in last 24 hours: Temp:  [97.8 F (36.6 C)-98.4 F (36.9 C)] 98.4 F (36.9 C) (06/18 1605) Pulse Rate:  [81-93] 93 (06/18 1605) Resp:  [16-18] 17 (06/18 1605) BP: (119-139)/(64-85) 139/85 (06/18 1605) SpO2:  [94 %-98 %] 96 % (06/18 1605) Weight:  [104.9 kg] 104.9 kg (06/18 0500)  PHYSICAL EXAM:  General: Alert, cooperative, no distress, appears stated age.  Extremities: left foot dressing removed Surgical incision intact- gauze drain present- which was removed    Skin:  Lymph: Cervical, supraclavicular normal. Neurologic: Grossly non-focal  Lab Results Recent Labs    10/10/18 0523 10/12/18 0424  WBC 8.1 5.8  HGB 13.1 13.0  HCT 37.4* 37.9*  NA 143 140  K 3.5 3.6  CL 110 108  CO2 23 22  BUN 19 17  CREATININE 1.02 1.18   Liver Panel No results for input(s): PROT, ALBUMIN, AST, ALT, ALKPHOS, BILITOT, BILIDIR, IBILI in the last 72 hours. Sedimentation Rate No results for input(s): ESRSEDRATE in the last 72 hours. C-Reactive Protein No results for input(s): CRP in the last 72 hours.  Microbiology:  Studies/Results: [place] Ekg Site Rite  Result Date: 10/12/2018 If Site Rite image not attached, placement could not be confirmed due to current cardiac rhythm.    Assessment/Plan: Staph aureus bacteremia: Likely source left foot. On nafcillin. l repeat blood cultures neg so far Underwent I&D septic second MTPJ  &  Bone biopsy second metatarsal  if surgical culture has MSSA or bone has osteomyelitis he may not need TEE as he will get atleast 4 weeks of IV antibiotic As he will be going on IV cefazolin need not wai to get the bone biopsy back as the duration can be determined as OP   Osteogenesis imperfecta with history of fractures in the past  Diabetes mellitus on insulin pump. Last hemoglobin A1c was 9  Hyperlipidemia on rosuvastatin  Discussed the management with the patient

## 2018-10-12 NOTE — Progress Notes (Addendum)
Inpatient Diabetes Program Recommendations  AACE/ADA: New Consensus Statement on Inpatient Glycemic Control (2015)  Target Ranges:  Prepandial:   less than 140 mg/dL      Peak postprandial:   less than 180 mg/dL (1-2 hours)      Critically ill patients:  140 - 180 mg/dL   Results for FREDIE, MAJANO (MRN [id number]) as of 10/12/2018 08:30  Ref. Range 10/11/2018 02:31 10/11/2018 05:59 10/11/2018 08:43 10/11/2018 10:01 10/11/2018 11:30 10/11/2018 13:19 10/11/2018 15:07 10/11/2018 21:35 10/11/2018 21:54 10/11/2018 22:06 10/11/2018 22:22 10/11/2018 23:04 10/11/2018 23:35 10/12/2018 00:06  Glucose-Capillary Latest Ref Range: 70 - 99 mg/dL 212 (H) 110 (H) 273 (H) 225 (H) 155 (H) 141 (H) 107 (H) 38 (LL) 46 (L) 52 (L) 82 168 (H) 241 (H) 255 (H)   Results for ASAD, KEEVEN (MRN [id number]) as of 10/12/2018 08:30  Ref. Range 10/12/2018 00:30 10/12/2018 02:06 10/12/2018 04:12 10/12/2018 07:54  Glucose-Capillary Latest Ref Range: 70 - 99 mg/dL 263 (H) 187 (H) 180 (H) 183 (H)     Home DM Meds: Insulin Pump  Current Orders: Insulin Pump Q4 hours     Hypoglycemic last night at bedtime--Resolved with treatment--Pump was temporarily suspended but resumed after CBGs stabilized.  Underwent Bone Biopsy yesterday for possible Osteomyelitis.    Insulin Pump Settings are as follows: Basal rates 12 am 2.35 units/hr 4 am 1.7 units/hr 12 pm 2.5 units/hr 24-hr basal = 53 units  Bolus settings I:C ratio 1:4 Sensitivity 15 Target at 12 AM 90-100, at 9 PM 100-120 Active insulin time = 5 hrs     Addendum 1:30pm- Met with pt today.  Pt A&O and able to independently operate his insulin pump.  Had just finished changing his set/site/reservoir when I came into the room.  Has had DM for 25 years and is very familiar with insulin pump.  No changes have been made to his pump settings above.  Has appt with his ENDO Dr. Gabriel Carina at the end of the month.  Told me he was frustrated that he had to take his Freestyle libre CGM off when  he came to the hospital b/c he had to get an MRI.  Explained to pt that if he has family or friends bring him another one, he can use the CGM in the hospital but that the RNs will still need to check fingerstick CBGs according to MD orders and hospital policy.  Pt stated understanding.  Stated he will probably just rely on the RNs for the [place] until he goes home.  Reviewed charting of the insulin pump with the RN caring for pt today.      --Will follow patient during hospitalization--  Wyn Quaker RN, MSN, CDE Diabetes Coordinator Inpatient Glycemic Control Team Team Pager: [phone] (8a-5p)

## 2018-10-12 NOTE — Progress Notes (Signed)
Daily Progress Note   Subjective  - 1 Day Post-Op  Follow-up biopsy of bone and drainage of possible septic joint.  Doing well  Objective Vitals:   10/11/18 1451 10/12/18 0004 10/12/18 0500 10/12/18 0753  BP: 128/76 119/64  132/69  Pulse: 84 81  82  Resp:  16  18  Temp: 98.2 F (36.8 C) 98.2 F (36.8 C)  97.8 F (36.6 C)  TempSrc: Oral   Oral  SpO2: 98% 94%  98%  Weight:   104.9 kg   Height:        Physical Exam: Incisions well coapted.  Packing removed.  Just bloody drainage.  No purulence.  Laboratory CBC    Component Value Date/Time   WBC 5.8 10/12/2018 0424   HGB 13.0 10/12/2018 0424   HCT 37.9 (L) 10/12/2018 0424   PLT 284 10/12/2018 0424    BMET    Component Value Date/Time   NA 140 10/12/2018 0424   K 3.6 10/12/2018 0424   CL 108 10/12/2018 0424   CO2 22 10/12/2018 0424   GLUCOSE 208 (H) 10/12/2018 0424   BUN 17 10/12/2018 0424   CREATININE 1.18 10/12/2018 0424   CALCIUM 8.5 (L) 10/12/2018 0424   GFRNONAA >60 10/12/2018 0424   GFRAA >60 10/12/2018 0424    Assessment/Planning: Bacteremia likely source was foot.  Status post I&D and bone biopsy.   At this point foot continues to improve.  Will continue to have patient monitored.  Dressing changed today.  Dressings can be performed every other day with a dry dressing.  ID has been consulted and will assist with antibiotics.  Okay for weightbearing to heel and OrthoWedge shoe.  OrthoWedge shoe has been ordered.  Once stable for discharge patient to follow-up with me in 2 weeks in the outpatient clinic.  Samara Deist A  10/12/2018, 12:28 PM

## 2018-10-13 LAB — GLUCOSE, CAPILLARY
Glucose-Capillary: 120 mg/dL — ABNORMAL HIGH (ref 70–99)
Glucose-Capillary: 136 mg/dL — ABNORMAL HIGH (ref 70–99)
Glucose-Capillary: 171 mg/dL — ABNORMAL HIGH (ref 70–99)
Glucose-Capillary: 63 mg/dL — ABNORMAL LOW (ref 70–99)
Glucose-Capillary: 77 mg/dL (ref 70–99)

## 2018-10-13 LAB — CULTURE, BLOOD (ROUTINE X 2): Special Requests: ADEQUATE

## 2018-10-13 LAB — CBC
HCT: 39.4 % (ref 39.0–52.0)
Hemoglobin: 13.7 g/dL (ref 13.0–17.0)
MCH: 30.3 pg (ref 26.0–34.0)
MCHC: 34.8 g/dL (ref 30.0–36.0)
MCV: 87.2 fL (ref 80.0–100.0)
Platelets: 327 10*3/uL (ref 150–400)
RBC: 4.52 MIL/uL (ref 4.22–5.81)
RDW: 12 % (ref 11.5–15.5)
WBC: 5.6 10*3/uL (ref 4.0–10.5)
nRBC: 0 % (ref 0.0–0.2)

## 2018-10-13 LAB — SURGICAL PATHOLOGY

## 2018-10-13 MED ORDER — CEFAZOLIN IV (FOR PTA / DISCHARGE USE ONLY): 2.0000 g | Freq: Three times a day (TID) | INTRAVENOUS | 0 refills | Status: DC

## 2018-10-13 MED ORDER — SODIUM CHLORIDE 0.9% FLUSH: 10.0000 mL | INTRAVENOUS | Status: DC | PRN

## 2018-10-13 MED ORDER — CEFAZOLIN SODIUM-DEXTROSE 2-4 GM/100ML-% IV SOLN
2.0000 g | Freq: Three times a day (TID) | INTRAVENOUS | Status: DC
  Administered 2018-10-13: 2 g via INTRAVENOUS
  Filled 2018-10-13 (×4): qty 100

## 2018-10-13 NOTE — Treatment Plan (Signed)
Diagnosis: Staph aureus bacteremia and osteomyelitis of the foot Baseline Creatinine 1.18    Allergies  Allergen Reactions  . Demerol [Meperidine Hcl] Nausea And Vomiting    OPAT Orders Cefazolin 2 grams IV every 8 hours until  11/19/18( 6 week course)   [facility] Care Per Protocol:including placement of biopatch  Labs weekly on mondays while on IV antibiotics: _X_ CBC with differential _X_ CMP Labs once in 2 weeks on a Monday while on antibioitcs _X_ CRP _X_ ESR   _X_ Please pull PIC at completion of IV antibiotics  Fax weekly labs to [phone]  Clinic Follow Up Appt:4 weeks  Call [phone] to make appt

## 2018-10-13 NOTE — TOC Transition Note (Signed)
Transition of Care [facility]) - CM/SW Discharge Note   Patient Details  Name: Aydn Ferrara MRN: [id number] Date of Birth: 1970-10-22  Transition of [facility]) CM/SW Contact:  Haidynn Almendarez, Lenice Llamas Phone Number: [phone]  10/13/2018, 2:29 PM   Clinical Narrative: Patient will D/C home today. Per RN patient will get his 2 pm dose of IV ABX and will be due for IV ABX at 10 pm. [facility] representative and [facility] representative are aware of above. [employer] agency representative has delivered rolling walker and bedside commode. Please reconsult if future social work needs arise. CSW signing off.     Final next level of care: Home w Home Health Services Barriers to Discharge: Continued Medical Work up   Patient Goals and CMS Choice   CMS Medicare.gov Compare Post Acute Care list provided to:: (Patient has open to [facility].) Choice offered to / list presented to : Patient  Discharge Placement                       Discharge Plan and Services   Discharge Planning Services: CM Consult Post Acute Care Choice: Home Health          DME Arranged: Bedside commode, Walker rolling DME Agency: [employer] Date DME Agency Contacted: 10/13/18   Representative spoke with at DME Agency: [place]: RN, IV Antibiotics [place] Agency: [place] Date [place]: 10/13/18   Representative spoke with at [place]: Corene Cornea and [facility]  Social Determinants of Health ([place]) Interventions     Readmission Risk Interventions No flowsheet data found.

## 2018-10-13 NOTE — Discharge Summary (Signed)
[facility] [place] NAME: Damon Anderson    MR#:  [id number]  DATE OF BIRTH:  09/14/1970  DATE OF ADMISSION:  10/09/2018 ADMITTING PHYSICIAN: Hillary Bow, MD  DATE OF DISCHARGE: 10/13/2018  PRIMARY CARE PHYSICIAN: Leafy Half, MD    ADMISSION DIAGNOSIS:  MSSA (methicillin susceptible Staphylococcus aureus) infection [A49.01] Cellulitis of left lower extremity [L03.116]  DISCHARGE DIAGNOSIS:  Active Problems:   MSSA bacteremia   SECONDARY DIAGNOSIS:   Past Medical History:  Diagnosis Date  . Diabetes mellitus without complication (HCC)    Type 1 with insulin pump  . Hypertension     HOSPITAL COURSE:   48 year old male with past medical history of essential hypertension, diabetes who presented to the hospital due to MSSA bacteremia.  1.  MSSA bacteremia secondary to left foot cellulitis- the source is the left foot cellulitis/osteomyelitis. -Initially patient was treated with broad-spectrum IV antibiotics on vancomycin, cefepime then narrowed down to just nafcillin and since patient has MSSA bacteremia now he is being discharged on IV Ancef. -Patient was followed by infectious disease while in the hospital and repeat blood cultures remain negative.  Patient has a PICC line now.  He is being discharged home with home health nursing services.  2.  Left foot cellulitis/osteomyelitis- patient is status post surgical debridement done by podiatry.  Postop day #2 today.   -Bone biopsy was positive for osteomyelitis.  Discussed with infectious disease no need for TEE given the MSSA bacteremia.  Patient will likely need 4 weeks of IV antibiotics.  Patient to be discharged on IV Ancef today.  Continue local wound care with dry dressing changes every other day.  Follow-up with podiatry in the next couple weeks.  3.  Diabetes type 2 without complication- pt. Will continue insulin pump. -Continue carb controlled diet.  Blood sugar  stable.  4.  Essential hypertension- pt. Will continue lisinopril/HCTZ.  5.  Hyperlipidemia- pt. Will continue Crestor.  DISCHARGE CONDITIONS:   Stable.   CONSULTS OBTAINED:  Treatment Team:  Samara Deist, DPM  DRUG ALLERGIES:   Allergies  Allergen Reactions  . Demerol [Meperidine Hcl] Nausea And Vomiting    DISCHARGE MEDICATIONS:   Allergies as of 10/13/2018      Reactions   Demerol [meperidine Hcl] Nausea And Vomiting      Medication List    STOP taking these medications   clindamycin 300 MG capsule Commonly known as: CLEOCIN   lisinopril 10 MG tablet Commonly known as: ZESTRIL     TAKE these medications   aspirin 325 MG tablet Take 325 mg by mouth daily.   ceFAZolin  IVPB Commonly known as: ANCEF Inject 2 g into the vein every 8 (eight) hours. Indication: MSSA bacteremia and osteomyelitis of toe Last Day of Therapy: 11/19/2018 Labs weekly on mondays while on IV antibiotics: _X_ CBC with differential _X_ CMP Labs once in 2 weeks on a Monday while on antibioitcs _X_ CRP _X_ ESR   cetirizine 10 MG tablet Commonly known as: ZYRTEC Take 10 mg by mouth daily.   Lantus 100 UNIT/ML injection Generic drug: insulin glargine Inject 56 Units into the skin daily as needed (backup insulin therapy).   lisinopril-hydrochlorothiazide 10-12.5 MG tablet Commonly known as: ZESTORETIC Take 1 tablet by mouth daily.   metFORMIN 500 MG 24 hr tablet Commonly known as: GLUCOPHAGE-XR Take 500 mg by mouth daily with supper.   multivitamin with minerals Tabs tablet Take 1 tablet by mouth daily.   NovoLOG  100 UNIT/ML injection Generic drug: insulin aspart Inject 120 Units into the skin as directed.   rosuvastatin 40 MG tablet Commonly known as: CRESTOR Take 40 mg by mouth daily.            Home Infusion Instuctions  (From admission, onward)         Start     Ordered   10/13/18 0000  Home infusion instructions [facility] May follow [facility]  Dosing Protocol; May administer Cathflo as needed to maintain patency of vascular access device.; Flushing of vascular access device: per [facility] Protocol: 0.9% NaCl pre/post medica...    Question Answer Comment  Instructions May follow [facility] Dosing Protocol   Instructions May administer Cathflo as needed to maintain patency of vascular access device.   Instructions Flushing of vascular access device: per [facility] Protocol: 0.9% NaCl pre/post medication administration and prn patency; Heparin 100 u/ml, 73m for implanted ports and Heparin 10u/ml, 578mfor all other central venous catheters.   Instructions May follow [facility] Anaphylaxis Protocol for First Dose Administration in the home: 0.9% NaCl at 25-50 ml/hr to maintain IV access for protocol meds. Epinephrine 0.3 ml IV/IM PRN and Benadryl 25-50 IV/IM PRN s/s of anaphylaxis.   Instructions [facility] Infusion Coordinator (RN) to assist per patient IV care needs in the home PRN.      10/13/18 1253           Durable Medical Equipment  (From admission, onward)         Start     Ordered   10/12/18 1409  For home use only DME Bedside commode  Once    Question:  Patient needs a bedside commode to treat with the following condition  Answer:  Diabetes mellitus ([facility]  10/12/18 1408   10/12/18 1408  For home use only DME Walker rolling  Once    Question:  Patient needs a walker to treat with the following condition  Answer:  Cellulitis   10/12/18 1408            DISCHARGE INSTRUCTIONS:   DIET:  Cardiac diet and Diabetic diet  DISCHARGE CONDITION:  Stable  ACTIVITY:  Activity as tolerated  OXYGEN:  Home Oxygen: No.   Oxygen Delivery: room air  DISCHARGE [place]ATION:  [place] [facility]   If you experience worsening of your admission symptoms, develop shortness of breath, life threatening emergency, suicidal or homicidal thoughts you must seek medical attention immediately by calling 911 or calling your MD  immediately  if symptoms less severe.  You Must read complete instructions/literature along with all the possible adverse reactions/side effects for all the Medicines you take and that have been prescribed to you. Take any new Medicines after you have completely understood and accpet all the possible adverse reactions/side effects.   Please note  You were cared for by a hospitalist during your hospital stay. If you have any questions about your discharge medications or the care you received while you were in the hospital after you are discharged, you can call the unit and asked to speak with the hospitalist on call if the hospitalist that took care of you is not available. Once you are discharged, your primary care physician will handle any further medical issues. Please note that NO REFILLS for any discharge medications will be authorized once you are discharged, as it is imperative that you return to your primary care physician (or establish a relationship with a primary care  physician if you do not have one) for your aftercare needs so that they can reassess your need for medications and monitor your lab values.     Today   No acute events overnight.  Patient's bone biopsy positive for osteomyelitis.  Afebrile, hemodynamically stable.  Repeat blood cultures remain negative.  Status post PICC line placed this morning.  Discussed with infectious disease and will discharge on long-term IV antibiotics today.  VITAL SIGNS:  Blood pressure 140/82, pulse 93, temperature 98.7 F (37.1 C), temperature source Oral, resp. rate 19, height 5' 8" (1.727 m), weight 104 kg, SpO2 99 %.  I/O:    Intake/Output Summary (Last 24 hours) at 10/13/2018 1418 Last data filed at 10/13/2018 1300 Gross per 24 hour  Intake 1313.56 ml  Output 900 ml  Net 413.56 ml    PHYSICAL EXAMINATION:   GENERAL:  48 y.o.-year-old patient lying in bed in no acute distress.  EYES: Pupils equal, round, reactive to light and  accommodation. No scleral icterus. Extraocular muscles intact.  HEENT: Head atraumatic, normocephalic. Oropharynx and nasopharynx clear.  NECK:  Supple, no jugular venous distention. No thyroid enlargement, no tenderness.  LUNGS: Normal breath sounds bilaterally, no wheezing, rales, rhonchi. No use of accessory muscles of respiration.  CARDIOVASCULAR: S1, S2 normal. No murmurs, rubs, or gallops.  ABDOMEN: Soft, nontender, nondistended. Bowel sounds present. No organomegaly or mass.  EXTREMITIES: No cyanosis, clubbing or edema b/l. Left foot ulcer s/p surgery with dressing in place.    NEUROLOGIC: Cranial nerves II through XII are intact. No focal Motor or sensory deficits b/l.   PSYCHIATRIC: The patient is alert and oriented x 3.  SKIN: No obvious rash, lesion, or ulcer.   DATA REVIEW:   CBC Recent Labs  Lab 10/13/18 0652  WBC 5.6  HGB 13.7  HCT 39.4  PLT 327    Chemistries  Recent Labs  Lab 10/08/18 0508  10/12/18 0424  NA 137   < > 140  K 4.0   < > 3.6  CL 105   < > 108  CO2 18*   < > 22  GLUCOSE 309*   < > 208*  BUN 25*   < > 17  CREATININE 1.10   < > 1.18  CALCIUM 8.4*   < > 8.5*  AST 18  --   --   ALT 23  --   --   ALKPHOS 100  --   --   BILITOT 1.3*  --   --    < > = values in this interval not displayed.    Cardiac Enzymes No results for input(s): TROPONINI in the last 168 hours.  Microbiology Results  Results for orders placed or performed during the hospital encounter of 10/09/18  Novel Coronavirus,NAA,(SEND-OUT TO REF LAB - TAT 24-48 hrs); Hosp Order     Status: None   Collection Time: 10/09/18  2:40 PM   Specimen: Nasopharyngeal Swab; Respiratory  Result Value Ref Range Status   SARS-CoV-2, NAA NOT DETECTED NOT DETECTED Final    Comment: (NOTE) This test was developed and its performance characteristics determined by [employer]. This test has not been FDA cleared or approved. This test has been authorized by FDA under an Emergency Use  Authorization (EUA). This test is only authorized for the duration of time the declaration that circumstances exist justifying the authorization of the emergency use of in vitro diagnostic tests for detection of SARS-CoV-2 virus and/or diagnosis of COVID-19 infection under section 564(b)(1) of  the Act, 21 U.S.C. 459XHF-4(F)(4), unless the authorization is terminated or revoked sooner. When diagnostic testing is negative, the possibility of a false negative result should be considered in the context of a patient's recent exposures and the presence of clinical signs and symptoms consistent with COVID-19. An individual without symptoms of COVID-19 and who is not shedding SARS-CoV-2 virus would expect to have a negative (not detected) result in this assay. Performed  At: [facility] [address] [place], [place] [id number] Rush Farmer MD XI:[id number]    [place]  Final    Comment: Performed at [facility], [address] [place], [place] [address]  Blood Culture (routine x 2)     Status: None (Preliminary result)   Collection Time: 10/09/18  2:40 PM   Specimen: BLOOD  Result Value Ref Range Status   Specimen Description BLOOD RIGHT ANTECUBITAL  Final   Special Requests   Final    BOTTLES DRAWN AEROBIC AND ANAEROBIC Blood Culture results may not be optimal due to an excessive volume of blood received in culture bottles   Culture   Final    NO GROWTH 4 DAYS Performed at [facility], [address], [place], [place] [address]    Report Status PENDING  Incomplete  Blood Culture (routine x 2)     Status: None (Preliminary result)   Collection Time: 10/09/18  2:40 PM   Specimen: BLOOD  Result Value Ref Range Status   Specimen Description BLOOD BLOOD RIGHT HAND  Final   Special Requests   Final    BOTTLES DRAWN AEROBIC AND ANAEROBIC Blood Culture adequate volume   Culture   Final    NO GROWTH 4 DAYS Performed at [facility], [address], [place], [place] [address]    Report Status PENDING  Incomplete  MRSA PCR Screening     Status: None   Collection Time: 10/10/18  9:41 PM   Specimen: Nasopharyngeal  Result Value Ref Range Status   MRSA by PCR NEGATIVE NEGATIVE Final    Comment:        The GeneXpert MRSA Assay (FDA approved for NASAL specimens only), is one component of a comprehensive MRSA colonization surveillance program. It is not intended to diagnose MRSA infection nor to guide or monitor treatment for MRSA infections. Performed at [facility], [address] [place], [place] [address]   Aerobic/Anaerobic Culture (surgical/deep wound)     Status: None (Preliminary result)   Collection Time: 10/11/18 12:49 PM   Specimen: [facility] Other; Tissue  Result Value Ref Range Status   Specimen Description   Final    TOE Performed at [facility], [address], [place], [place] [address]    Special Requests   Final    LEFT TOE SWAB COLLECTED IN OR Performed at [facility], [address] [place], [place] [address]    Gram Stain NO WBC SEEN NO ORGANISMS SEEN   Final   Culture   Final    NO GROWTH 2 DAYS NO ANAEROBES ISOLATED; CULTURE IN PROGRESS FOR 5 DAYS Performed at [place] [facility] [address], [place], [place] [address]    Report Status PENDING  Incomplete  Aerobic/Anaerobic Culture (surgical/deep wound)     Status: None (Preliminary result)   Collection Time: 10/11/18 12:49 PM   Specimen: [facility] Other; Tissue  Result Value Ref Range Status   Specimen Description   Final    BONE Performed at [facility],  [place], [place] [address]    Special Requests   Final    LEFT SECOND TOE JOINT Performed at [facility], [address] [place], [place] [address]    Gram Stain   Final    MODERATE WBC PRESENT, PREDOMINANTLY PMN NO ORGANISMS SEEN    Culture   Final    NO GROWTH 2 DAYS NO ANAEROBES ISOLATED;  CULTURE IN PROGRESS FOR 5 DAYS Performed at [facility] [address], [place], [place] [address]    Report Status PENDING  Incomplete    RADIOLOGY:  [place] Ekg Site Rite  Result Date: 10/12/2018 If Site Rite image not attached, placement could not be confirmed due to current cardiac rhythm.     Management plans discussed with the patient, family and they are in agreement.  CODE STATUS:     Code Status Orders  (From admission, onward)         Start     Ordered   10/09/18 1513  Full code  Continuous     10/09/18 1514       TOTAL TIME TAKING CARE OF THIS PATIENT: 40 minutes.    Henreitta Leber M.D on 10/13/2018 at 2:18 PM  Between 7am to 6pm - Pager - [phone]  After 6pm go to [email or web address] - Proofreader  [facility]  Office  [phone]  CC: Primary care physician; Leafy Half, MD

## 2018-10-13 NOTE — Progress Notes (Signed)
Peripherally Inserted Central Catheter/Midline Placement  The IV Nurse has discussed with the patient and/or persons authorized to consent for the patient, the purpose of this procedure and the potential benefits and risks involved with this procedure.  The benefits include less needle sticks, lab draws from the catheter, and the patient may be discharged home with the catheter. Risks include, but not limited to, infection, bleeding, blood clot (thrombus formation), and puncture of an artery; nerve damage and irregular heartbeat and possibility to perform a PICC exchange if needed/ordered by physician.  Alternatives to this procedure were also discussed.  Bard Power PICC patient education guide, fact sheet on infection prevention and patient information card has been provided to patient /or left at bedside.    PICC/Midline Placement Documentation  PICC Single Lumen 02/09/50 PICC Right Basilic 41 cm 0 cm (Active)  Indication for Insertion or Continuance of Line Home intravenous therapies (PICC only) 10/13/18 0945  Exposed Catheter (cm) 0 cm 10/13/18 0945  Site Assessment Clean;Dry;Intact 10/13/18 0945  Line Status Flushed;Blood return noted;Saline locked 10/13/18 0945  Dressing Type Transparent 10/13/18 0945  Dressing Status Clean;Intact;Dry;Antimicrobial disc in place 10/13/18 0945  Dressing Change Due 10/20/18 10/13/18 0945       Scotty Court 10/13/2018, 10:12 AM

## 2018-10-13 NOTE — Progress Notes (Signed)
Discharge summary reviewed with verbal understanding. Answered all questions. Escorted to personal vehicle. 

## 2018-10-13 NOTE — Progress Notes (Signed)
PHARMACY CONSULT NOTE FOR:  OUTPATIENT  PARENTERAL ANTIBIOTIC THERAPY (OPAT)  Indication:  MSSA bacteremia and osteomyelitis of toe Regimen: Cefazolin 2gm IV q8h End date: 11/19/2018  IV antibiotic discharge orders are pended. To discharging provider:  please sign these orders via discharge navigator,  Select New Orders & click on the button choice - Manage This Unsigned Work.     Thank you for allowing pharmacy to be a part of this patient's care.  Doreene Eland, PharmD, BCPS.   Work Cell: [phone] 10/13/2018 10:48 AM

## 2018-10-14 LAB — CULTURE, BLOOD (ROUTINE X 2)
Culture: NO GROWTH
Culture: NO GROWTH
Special Requests: ADEQUATE

## 2018-10-16 LAB — AEROBIC/ANAEROBIC CULTURE W GRAM STAIN (SURGICAL/DEEP WOUND)
Culture: NO GROWTH
Culture: NO GROWTH
Gram Stain: NONE SEEN

## 2018-10-16 NOTE — Progress Notes (Signed)
6/22: Per 1A nursing director patient needs a work note. Per Hoyle Sauer with [employer] she will ask the Hospitalist that discharged patient to write a work note in [employer]. Clinical Social Worker (CSW) contacted patient and made him aware of above. CSW explained to patient that he can contact medical records and get the work note. CSW contacted medical records and made them aware of above. Per staff member in medical records she is going to mail patient a form to complete to receive his medical records.   [employer], LCSW [phone]

## 2018-10-17 ENCOUNTER — Encounter: Payer: Self-pay | Admitting: Specialist

## 2018-10-17 NOTE — Progress Notes (Signed)
[place] was admitted to the Hospital for evaluation and treatment of Osteomyelitis on 10/09/2018 and Discharged  10/17/2018 and should be excused from work/school   for 14 days starting 10/09/2018 , may return to work/school without any restrictions.  Call Abel Presto MD, [facility]  [phone] with questions.  Henreitta Leber M.D on 10/17/2018,at 2:25 PM

## 2018-10-22 ENCOUNTER — Emergency Department
Admission: EM | Admit: 2018-10-22 | Discharge: 2018-10-22 | Disposition: A | Discharge status: Home / Self Care | Payer: BC Managed Care – PPO | Attending: Emergency Medicine | Admitting: Emergency Medicine

## 2018-10-22 ENCOUNTER — Emergency Department: Payer: BC Managed Care – PPO

## 2018-10-22 ENCOUNTER — Other Ambulatory Visit: Payer: Self-pay

## 2018-10-22 ENCOUNTER — Encounter: Payer: Self-pay | Admitting: Emergency Medicine

## 2018-10-22 DIAGNOSIS — Z794 Long term (current) use of insulin: Secondary | ICD-10-CM | POA: Diagnosis not present

## 2018-10-22 DIAGNOSIS — I10 Essential (primary) hypertension: Secondary | ICD-10-CM | POA: Diagnosis not present

## 2018-10-22 DIAGNOSIS — J029 Acute pharyngitis, unspecified: Secondary | ICD-10-CM | POA: Diagnosis not present

## 2018-10-22 DIAGNOSIS — T82898A Other specified complication of vascular prosthetic devices, implants and grafts, initial encounter: Secondary | ICD-10-CM | POA: Diagnosis not present

## 2018-10-22 DIAGNOSIS — E109 Type 1 diabetes mellitus without complications: Secondary | ICD-10-CM | POA: Diagnosis not present

## 2018-10-22 DIAGNOSIS — Z87891 Personal history of nicotine dependence: Secondary | ICD-10-CM | POA: Insufficient documentation

## 2018-10-22 DIAGNOSIS — Z9641 Presence of insulin pump (external) (internal): Secondary | ICD-10-CM | POA: Diagnosis not present

## 2018-10-22 DIAGNOSIS — Z79899 Other long term (current) drug therapy: Secondary | ICD-10-CM | POA: Diagnosis not present

## 2018-10-22 DIAGNOSIS — Y69 Unspecified misadventure during surgical and medical care: Secondary | ICD-10-CM | POA: Insufficient documentation

## 2018-10-22 DIAGNOSIS — T7840XA Allergy, unspecified, initial encounter: Secondary | ICD-10-CM | POA: Diagnosis not present

## 2018-10-22 LAB — COMPREHENSIVE METABOLIC PANEL
ALT: 7 U/L (ref 0–44)
AST: 24 U/L (ref 15–41)
Albumin: 4 g/dL (ref 3.5–5.0)
Alkaline Phosphatase: 101 U/L (ref 38–126)
Anion gap: 13 (ref 5–15)
BUN: 18 mg/dL (ref 6–20)
CO2: 20 mmol/L — ABNORMAL LOW (ref 22–32)
Calcium: 8.9 mg/dL (ref 8.9–10.3)
Chloride: 105 mmol/L (ref 98–111)
Creatinine, Ser: 0.88 mg/dL (ref 0.61–1.24)
GFR calc Af Amer: >60 mL/min (ref >60)
GFR calc non Af Amer: >60 mL/min (ref >60)
Glucose, Bld: 201 mg/dL — ABNORMAL HIGH (ref 70–99)
Potassium: 4 mmol/L (ref 3.5–5.1)
Sodium: 138 mmol/L (ref 135–145)
Total Bilirubin: 0.9 mg/dL (ref 0.3–1.2)
Total Protein: 7.5 g/dL (ref 6.5–8.1)

## 2018-10-22 LAB — CBC WITH DIFFERENTIAL/PLATELET
Abs Immature Granulocytes: 0.04 10*3/uL (ref 0.00–0.07)
Basophils Absolute: 0 10*3/uL (ref 0.0–0.1)
Basophils Relative: 1 %
Eosinophils Absolute: 0.3 10*3/uL (ref 0.0–0.5)
Eosinophils Relative: 4 %
HCT: 39.7 % (ref 39.0–52.0)
Hemoglobin: 14 g/dL (ref 13.0–17.0)
Immature Granulocytes: 1 %
Lymphocytes Relative: 22 %
Lymphs Abs: 1.7 10*3/uL (ref 0.7–4.0)
MCH: 30.7 pg (ref 26.0–34.0)
MCHC: 35.3 g/dL (ref 30.0–36.0)
MCV: 87.1 fL (ref 80.0–100.0)
Monocytes Absolute: 0.7 10*3/uL (ref 0.1–1.0)
Monocytes Relative: 9 %
Neutro Abs: 4.8 10*3/uL (ref 1.7–7.7)
Neutrophils Relative %: 63 %
Platelets: 408 10*3/uL — ABNORMAL HIGH (ref 150–400)
RBC: 4.56 MIL/uL (ref 4.22–5.81)
RDW: 12.8 % (ref 11.5–15.5)
Smear Review: NORMAL
WBC: 7.7 10*3/uL (ref 4.0–10.5)
nRBC: 0 % (ref 0.0–0.2)

## 2018-10-22 LAB — PROTIME-INR
INR: 1 (ref 0.8–1.2)
Prothrombin Time: 13.1 seconds (ref 11.4–15.2)

## 2018-10-22 LAB — APTT: aPTT: 28 seconds (ref 24–36)

## 2018-10-22 MED ORDER — DIPHENHYDRAMINE HCL 25 MG PO CAPS: 25.0000 mg | ORAL_CAPSULE | Freq: Four times a day (QID) | ORAL | 0 refills | Status: DC | PRN

## 2018-10-22 MED ORDER — DOXYCYCLINE MONOHYDRATE 100 MG PO TABS: 100.0000 mg | ORAL_TABLET | Freq: Two times a day (BID) | ORAL | 0 refills | Status: AC

## 2018-10-22 MED ORDER — FAMOTIDINE IN NACL 20-0.9 MG/50ML-% IV SOLN
20.0000 mg | Freq: Once | INTRAVENOUS | Status: AC
  Administered 2018-10-22: 20 mg via INTRAVENOUS
  Filled 2018-10-22: qty 50

## 2018-10-22 MED ORDER — LISINOPRIL-HYDROCHLOROTHIAZIDE 10-12.5 MG PO TABS: 1.0000 | ORAL_TABLET | Freq: Every day | ORAL | 0 refills | Status: AC

## 2018-10-22 MED ORDER — DOXYCYCLINE HYCLATE 100 MG PO TABS
100.0000 mg | ORAL_TABLET | Freq: Once | ORAL | Status: AC
  Administered 2018-10-22: 100 mg via ORAL
  Filled 2018-10-22: qty 1

## 2018-10-22 MED ORDER — FAMOTIDINE 20 MG PO TABS: 20.0000 mg | ORAL_TABLET | Freq: Two times a day (BID) | ORAL | 1 refills | Status: DC

## 2018-10-22 MED ORDER — DIPHENHYDRAMINE HCL 50 MG/ML IJ SOLN
50.0000 mg | Freq: Once | INTRAMUSCULAR | Status: AC
  Administered 2018-10-22: 50 mg via INTRAVENOUS
  Filled 2018-10-22: qty 1

## 2018-10-22 MED ORDER — ALTEPLASE 2 MG IJ SOLR
2.0000 mg | Freq: Once | INTRAMUSCULAR | Status: AC
  Administered 2018-10-22: 19:00:00 2 mg
  Filled 2018-10-22: qty 2

## 2018-10-22 NOTE — ED Triage Notes (Signed)
Pt presnets to ED via POV, states had PIC line placed and was doing home health infusions at home, today PICC line wouldn't flush. Denies further complaints at this time.

## 2018-10-22 NOTE — ED Notes (Signed)
Report from kim, rn.  

## 2018-10-22 NOTE — ED Notes (Signed)
tv turned on for pt, channel changed per request. Pt updated that iv team will return at 2030 to attempt to flush picc.

## 2018-10-22 NOTE — Discharge Instructions (Signed)
You have been diagnosed with drug rash. Infectious disease physician Dr. Johnnye Sima recommended transitioning you to doxycycline given culture results. Take doxycycline twice daily until follow-up with your infectious disease physician.  Take Benadryl and famotidine until rash completely resolves. Return to the emergency department with new or worsening symptoms.

## 2018-10-22 NOTE — ED Notes (Signed)
IV team here 

## 2018-10-22 NOTE — ED Notes (Signed)
Pt states picc line is "clogged". Pt states he did an "infusion" last night at 11 pm and when he attempted his 7 am infusion it would work. He called home health nurse who attempted to clear the line with heparin w/o success. Pt taking antibiotics for osteomylitis. Pt NAD at this time.

## 2018-10-22 NOTE — ED Notes (Signed)
Spoke with marc in iv team, per mark will be down at 2130 to attempt to flush iv. Pt updated on wait.

## 2018-10-22 NOTE — ED Provider Notes (Signed)
[facility] Emergency Department Provider Note  ____________________________________________  Time seen: Approximately 4:25 PM  I have reviewed the triage vital signs and the nursing notes.   HISTORY  Chief Complaint Vascular Access Problem    HPI Damon Anderson is a 48 y.o. male with a history of left foot osteomyelitis, diabetes and hypertension, presents to the emergency department with multiple concerns.  Patient states that he is primarily here because he has experienced an issue with the patency of his PICC line for the past 24 hours.  Patient states that he was able to take his medications without issue at approximately 11:00 PM last night.  Patient states that he contacted his home health nurse and expressed concerns about PICC line and she cannot resolve issues at home and recommended ED care.  Patient secondarily complains of pharyngitis that started on Friday.  He also states that he has had a sensation of lower lip swelling and tongue thickness.  He has developed a macular rash along the volar aspect of the upper extremities bilaterally and along the back.  He denies chest pain, chest tightness, shortness of breath, nausea, vomiting, diarrhea or abdominal pain.  Patient has been on Ancef at home since 10/13/18.         Past Medical History:  Diagnosis Date  . Diabetes mellitus without complication (HCC)    Type 1 with insulin pump  . Hypertension     Patient Active Problem List   Diagnosis Date Noted  . MSSA bacteremia 10/09/2018    Past Surgical History:  Procedure Laterality Date  . APPENDECTOMY    . ELBOW FRACTURE SURGERY Right   . INCISION AND DRAINAGE Left 10/11/2018   Procedure: INCISION AND DRAINAGE WITH BONE BIOPSY;  Surgeon: Samara Deist, DPM;  Location: [facility] ORS;  Service: Podiatry;  Laterality: Left;  . JOINT REPLACEMENT Left   . WRIST ARTHROPLASTY Right    pinning    Prior to Admission medications   Medication Sig Start Date  End Date Taking? Authorizing Provider  aspirin 325 MG tablet Take 325 mg by mouth daily.    [provider]  ceFAZolin (ANCEF) IVPB Inject 2 g into the vein every 8 (eight) hours. Indication: MSSA bacteremia and osteomyelitis of toe Last Day of Therapy: 11/19/2018 Labs weekly on mondays while on IV antibiotics: _X_ CBC with differential _X_ CMP Labs once in 2 weeks on a Monday while on antibioitcs _X_ CRP _X_ ESR 10/13/18 11/19/18  Henreitta Leber, MD  cetirizine (ZYRTEC) 10 MG tablet Take 10 mg by mouth daily.    [provider]  diphenhydrAMINE (BENADRYL ALLERGY) 25 mg capsule Take 1 capsule (25 mg total) by mouth every 6 (six) hours as needed for up to 5 days. 10/22/18 10/27/18  Lannie Fields, PA-C  doxycycline (ADOXA) 100 MG tablet Take 1 tablet (100 mg total) by mouth 2 (two) times daily for 10 days. 10/22/18 11/01/18  Lannie Fields, PA-C  famotidine (PEPCID) 20 MG tablet Take 1 tablet (20 mg total) by mouth 2 (two) times daily for 7 days. 10/22/18 10/29/18  Lannie Fields, PA-C  insulin aspart (NOVOLOG) 100 UNIT/ML injection Inject 120 Units into the skin as directed.  01/20/18   [provider]  insulin glargine (LANTUS) 100 UNIT/ML injection Inject 56 Units into the skin daily as needed (backup insulin therapy).  08/07/18 08/07/19  [provider]  lisinopril-hydrochlorothiazide (ZESTORETIC) 10-12.5 MG tablet Take 1 tablet by mouth daily for 30 days. 10/22/18 11/21/18  Sherral Hammers, Ellison Hughs M, PA-C  metFORMIN (GLUCOPHAGE-XR) 500 MG 24 hr tablet Take 500 mg by mouth daily with supper. 07/19/18 07/19/19  [provider]  Multiple Vitamin (MULTIVITAMIN WITH MINERALS) TABS tablet Take 1 tablet by mouth daily.    [provider]  rosuvastatin (CRESTOR) 40 MG tablet Take 40 mg by mouth daily. 07/19/18   [provider]    Allergies Demerol [meperidine hcl]  Family History  Problem Relation Age of Onset  . Diabetes Mother   . Hypertension Father      Social History Social History   Tobacco Use  . Smoking status: Former Smoker    Quit date: 01/10/2018    Years since quitting: 0.7  . Smokeless tobacco: Never Used  Substance Use Topics  . Alcohol use: Never    Frequency: Never  . Drug use: Never     Review of Systems  Constitutional: No fever/chills Eyes: No visual changes. No discharge ENT: Patient has concerns for lower lip swelling.  Cardiovascular: no chest pain. Respiratory: no cough. No SOB. Gastrointestinal: No abdominal pain.  No nausea, no vomiting.  No diarrhea.  No constipation. Genitourinary: Negative for dysuria. No hematuria Musculoskeletal: Negative for musculoskeletal pain. Skin: Patient has rash.  Neurological: Negative for headaches, focal weakness or numbness.   ____________________________________________   PHYSICAL EXAM:  VITAL SIGNS: ED Triage Vitals [10/22/18 1135]  Enc Vitals Group     BP (!) 142/69     Pulse Rate (!) 102     Resp 20     Temp 99 F (37.2 C)     Temp Source Oral     SpO2 98 %     Weight 229 lb 4.5 oz (104 kg)     Height '5\' 8"'  (1.727 m)     Head Circumference      Peak Flow      Pain Score 0     Pain Loc      Pain Edu?      Excl. in [place]?      Constitutional: Alert and oriented. Well appearing and in no acute distress. Eyes: Conjunctivae are normal. PERRL. EOMI. Head: Atraumatic. ENT:      Nose: No congestion/rhinnorhea.      Mouth/Throat: Mucous membranes are moist.  Lower lip appears mildly edematous.  Airway is patent.  Very little tonsillar matter visualized.  Neck: No stridor.  No cervical spine tenderness to palpation. Cardiovascular: Normal rate, regular rhythm. Normal S1 and S2.  Good peripheral circulation. Respiratory: Normal respiratory effort without tachypnea or retractions. Lungs CTAB. Good air entry to the bases with no decreased or absent breath sounds. Gastrointestinal: Bowel sounds 4 quadrants. Soft and nontender to palpation. No guarding or  rigidity. No palpable masses. No distention. No CVA tenderness. Musculoskeletal: Full range of motion to all extremities. No gross deformities appreciated. Neurologic:  Normal speech and language. No gross focal neurologic deficits are appreciated.  Skin: Patient has macular, erythematous rash along the volar aspect of the upper extremities.  Psychiatric: Mood and affect are normal. Speech and behavior are normal. Patient exhibits appropriate insight and judgement.   ____________________________________________   LABS (all labs ordered are listed, but only abnormal results are displayed)  Labs Reviewed  CBC WITH DIFFERENTIAL/PLATELET - Abnormal; Notable for the following components:      Result Value   Platelets 408 (*)    All other components within normal limits  COMPREHENSIVE METABOLIC PANEL - Abnormal; Notable for the following components:   CO2 20 (*)  Glucose, Bld 201 (*)    All other components within normal limits  PROTIME-INR  APTT   ____________________________________________  EKG   ____________________________________________  RADIOLOGY I personally viewed and evaluated these images as part of my medical decision making, as well as reviewing the written report by the radiologist.    Dg Chest 2 View  Result Date: 10/22/2018 CLINICAL DATA:  Central catheter placement EXAM: CHEST - 2 VIEW COMPARISON:  None. FINDINGS: Central catheter tip is in the superior vena cava. No pneumothorax. Lungs are clear. Heart size and pulmonary vascularity are normal. No adenopathy. There is anterior wedging of several thoracic vertebral bodies. IMPRESSION: Central catheter tip in superior vena cava. No pneumothorax. Lungs clear. No evident adenopathy. Electronically Signed   By: Lowella Grip III M.D.   On: 10/22/2018 16:28    ____________________________________________    PROCEDURES  Procedure(s) performed:    Procedures    Medications  doxycycline (VIBRA-TABS)  tablet 100 mg (has no administration in time range)  alteplase (CATHFLO ACTIVASE) injection 2 mg (2 mg Intracatheter Given 10/22/18 1834)  diphenhydrAMINE (BENADRYL) injection 50 mg (50 mg Intravenous Given 10/22/18 1728)  famotidine (PEPCID) IVPB 20 mg premix (0 mg Intravenous Stopped 10/22/18 1902)     ____________________________________________   INITIAL IMPRESSION / ASSESSMENT AND PLAN / ED COURSE  Pertinent labs & imaging results that were available during my care of the patient were reviewed by me and considered in my medical decision making (see chart for details).  Review of the [place] CSRS was performed in accordance of the [place] prior to dispensing any controlled drugs.         Assessment and plan:  Issues with PICC line:  Allergic reaction:   48 year old male presents to the emergency department with concerns for PICC line patency and for possible allergic reaction.  On physical exam, patient is hypertensive and mildly tachycardic.  He does have a macular rash along the volar aspect of the upper extremities.  Airway is patent and there is no apparent angioedema.  No tongue swelling was appreciated.  Differential diagnosis included PICC line occlusion and allergic reaction.  Spoke with Dr. Johnnye Sima, on-call infectious disease specialist.  Very much appreciate consult.  Specifically, I reviewed patient's history and culture results with Dr. Johnnye Sima.  He recommended transitioning patient to doxycycline given likely Ancef allergy.  Patient was advised to follow-up with his infectious disease physician as soon as possible.  Basic labs were reassuring.  TPA was placed on PICC line but was not successful for clearing occlusion.  Patient was given heparin in the emergency department and first dose of doxycycline as well as Benadryl and famotidine.  Patient was discharged with Benadryl, famotidine and doxycycline.  Strict return precautions were given to return to the emergency department  for new or worsening symptoms.  All patient questions were answered.  Attending Dr. Ellender Hose agrees with plan of care.    ____________________________________________  FINAL CLINICAL IMPRESSION(S) / ED DIAGNOSES  Final diagnoses:  Occlusion of peripherally inserted central catheter (PICC) line, initial encounter ([place])  Allergic reaction, initial encounter      NEW MEDICATIONS STARTED DURING THIS VISIT:  ED Discharge Orders         Ordered    doxycycline (ADOXA) 100 MG tablet  2 times daily     10/22/18 2119    diphenhydrAMINE (BENADRYL ALLERGY) 25 mg capsule  Every 6 hours PRN     10/22/18 2119    famotidine (PEPCID) 20 MG tablet  2  times daily     10/22/18 2119    lisinopril-hydrochlorothiazide (ZESTORETIC) 10-12.5 MG tablet  Daily     10/22/18 2119              This chart was dictated using voice recognition software/Dragon. Despite best efforts to proofread, errors can occur which can change the meaning. Any change was purely unintentional.    Karren Cobble 10/22/18 2138    Duffy Bruce, MD 10/25/18 [phone]

## 2018-10-22 NOTE — Progress Notes (Signed)
Cathflo was inserted in the PICC line without successful declotting. Dr Ellender Hose advised me to insert Heparin so the pt can go home. Pt agreed to Heparinize PICC port so he can return home. Heparin inserted and instructions given to pt.

## 2018-10-23 ENCOUNTER — Telehealth: Payer: Self-pay | Admitting: Infectious Diseases

## 2018-10-23 ENCOUNTER — Other Ambulatory Visit: Payer: Self-pay | Admitting: Infectious Diseases

## 2018-10-23 DIAGNOSIS — B9561 Methicillin susceptible Staphylococcus aureus infection as the cause of diseases classified elsewhere: Secondary | ICD-10-CM

## 2018-10-23 NOTE — Telephone Encounter (Signed)
Patient called stating he was admitted to ER and that his medicine was changed from IV to oral. Patient wanted to make Dr. Darlyn Chamber of what was going on and wanted to make sure it was the right choice since there was a problem with his PICC line. He said he sent message through [place] but wanted to make sure the message got through.   [phone] call back number   Thanks

## 2018-10-23 NOTE — Telephone Encounter (Signed)
Over the weekend patient's  PICC like clotted and he went to the ED yesterday but that could not be opened. He is on IV cefazolin for MSSA bacteremia and osteo- he has received total of 14 days so far. Pt also developed some rash/ithing to the palms and said his tongue was feeling like it was swollen. He was given doxy in the ED Plan is to remove PICC. He will see me tomorrow and will arrange for a new PICC- will discuss treatment options including Iv vanco, Po lineoid, IV dapto etx

## 2018-10-24 ENCOUNTER — Ambulatory Visit: Payer: BC Managed Care – PPO

## 2018-10-24 ENCOUNTER — Other Ambulatory Visit: Payer: Self-pay

## 2018-10-24 ENCOUNTER — Ambulatory Visit: Payer: BC Managed Care – PPO | Attending: Infectious Diseases | Admitting: Infectious Diseases

## 2018-10-24 VITALS — BP 136/80 | HR 99 | Temp 98.6°F

## 2018-10-24 DIAGNOSIS — R7881 Bacteremia: Secondary | ICD-10-CM | POA: Diagnosis not present

## 2018-10-24 DIAGNOSIS — M00072 Staphylococcal arthritis, left ankle and foot: Secondary | ICD-10-CM | POA: Diagnosis not present

## 2018-10-24 DIAGNOSIS — Z794 Long term (current) use of insulin: Secondary | ICD-10-CM

## 2018-10-24 DIAGNOSIS — M86172 Other acute osteomyelitis, left ankle and foot: Secondary | ICD-10-CM | POA: Insufficient documentation

## 2018-10-24 DIAGNOSIS — Z9641 Presence of insulin pump (external) (internal): Secondary | ICD-10-CM

## 2018-10-24 DIAGNOSIS — Q780 Osteogenesis imperfecta: Secondary | ICD-10-CM

## 2018-10-24 DIAGNOSIS — L03116 Cellulitis of left lower limb: Secondary | ICD-10-CM

## 2018-10-24 DIAGNOSIS — Z87311 Personal history of (healed) other pathological fracture: Secondary | ICD-10-CM

## 2018-10-24 DIAGNOSIS — I10 Essential (primary) hypertension: Secondary | ICD-10-CM

## 2018-10-24 DIAGNOSIS — B9561 Methicillin susceptible Staphylococcus aureus infection as the cause of diseases classified elsewhere: Secondary | ICD-10-CM

## 2018-10-24 DIAGNOSIS — E1169 Type 2 diabetes mellitus with other specified complication: Secondary | ICD-10-CM

## 2018-10-24 DIAGNOSIS — Z79899 Other long term (current) drug therapy: Secondary | ICD-10-CM

## 2018-10-24 DIAGNOSIS — M869 Osteomyelitis, unspecified: Secondary | ICD-10-CM | POA: Diagnosis not present

## 2018-10-24 DIAGNOSIS — Z885 Allergy status to narcotic agent status: Secondary | ICD-10-CM

## 2018-10-24 DIAGNOSIS — Z87891 Personal history of nicotine dependence: Secondary | ICD-10-CM

## 2018-10-24 DIAGNOSIS — E785 Hyperlipidemia, unspecified: Secondary | ICD-10-CM

## 2018-10-24 MED ORDER — LINEZOLID 600 MG PO TABS: 600.0000 mg | ORAL_TABLET | Freq: Two times a day (BID) | ORAL | 0 refills | Status: DC

## 2018-10-24 NOTE — Progress Notes (Signed)
NAME: Damon Anderson  DOB: 1970-12-09  MRN: [id number]  Date/Time: 10/24/2018 10:05 AM  Subjective:  Follow-up visit after recent hospitalization. ? Damon Anderson is a 48 y.o. male with a history of diabetes mellitus on insulin pump, osteogenesis imperfecta, hypertension here for follow-up for his recent hospitalization.  Patient was in [facility] between 10/09/2018- 10/13/2018 with fever and left foot swelling.  He was found to have staph aureus bacteremia and MRI of the left foot showed changes of [place] infraction involving the second metatarsal head with superimposed septic arthritis and osteomyelitis in and around the second MTP joint.  And also cellulitis and mild fasciitis over the soft tissue.  He was seen by Dr. Vickki Muff, podiatrist and underwent I/d of the septic joint of the MTP joint and bone biopsy of the second metatarsal head.  The bone was also sent for culture and pathology.  It had moderate WBC but there was no growth.  The pathology did show acute osteomyelitis of the second toe adjacent to the articular cartilage.  He was discharged home on IV cefazolin for 6 weeks to complete on 11/19/2018.  On 10/21/2018 patient noted that his tongue was swollen.  He also noted an itchy rash on his palms.  2 days prior to that he had a sore throat-like sensation. He also found out on 10/21/2026 that the PICC line was not working and he could not flush it.  His last dose was on that day.  He came to the emergency department on 10/22/2018 Sunday and they tried to open the PICC line but could not.  He was sent home on doxycycline and Benadryl and asked to follow-up with me.  The PICC line was removed on 10/23/2018 by his visiting nurse on my direction. Today patient is in the office and is doing okay.  He says the itching  palms is better.  The rash is gone.  He has no fever.  We discussed the different options for continuing his antibiotic.    Past medical history Diabetes Hyperlipidemia Hypertension Osteogenesis  imperfecta Obesity Osteoarthritis Depression Cellulitis of the legs with swelling of the legs.  For left foot cellulitis in January 2020  Surgical history Appendectomy Arthroplasty hip Fracture right hand  Femoral fracture left with repositioning with screws  Social history Former smoker Drinks beer socially Lives on his own    Family History  Problem Relation Age of Onset  . Diabetes Mother   . Hypertension Father    Allergies  Allergen Reactions  . Demerol [Meperidine Hcl] Nausea And Vomiting    ? Current Outpatient Medications  Medication Sig Dispense Refill  . aspirin 325 MG tablet Take 325 mg by mouth daily.    Marland Kitchen ceFAZolin (ANCEF) IVPB Inject 2 g into the vein every 8 (eight) hours. Indication: MSSA bacteremia and osteomyelitis of toe Last Day of Therapy: 11/19/2018 Labs weekly on mondays while on IV antibiotics: _X_ CBC with differential _X_ CMP Labs once in 2 weeks on a Monday while on antibioitcs _X_ CRP _X_ ESR 110 Units 0  . cetirizine (ZYRTEC) 10 MG tablet Take 10 mg by mouth daily.    . diphenhydrAMINE (BENADRYL ALLERGY) 25 mg capsule Take 1 capsule (25 mg total) by mouth every 6 (six) hours as needed for up to 5 days. 30 capsule 0  . doxycycline (ADOXA) 100 MG tablet Take 1 tablet (100 mg total) by mouth 2 (two) times daily for 10 days. 20 tablet 0  . famotidine (PEPCID) 20 MG tablet Take 1 tablet (  20 mg total) by mouth 2 (two) times daily for 7 days. 30 tablet 1  . insulin aspart (NOVOLOG) 100 UNIT/ML injection Inject 120 Units into the skin as directed.     . insulin glargine (LANTUS) 100 UNIT/ML injection Inject 56 Units into the skin daily as needed (backup insulin therapy).     Marland Kitchen lisinopril-hydrochlorothiazide (ZESTORETIC) 10-12.5 MG tablet Take 1 tablet by mouth daily for 30 days. 30 tablet 0  . metFORMIN (GLUCOPHAGE-XR) 500 MG 24 hr tablet Take 500 mg by mouth daily with supper.    . Multiple Vitamin (MULTIVITAMIN WITH MINERALS) TABS tablet Take 1  tablet by mouth daily.    . rosuvastatin (CRESTOR) 40 MG tablet Take 40 mg by mouth daily.     No current facility-administered medications for this visit.      Abtx:  Anti-infectives (From admission, onward)   None      REVIEW OF SYSTEMS:  Const: negative fever, negative chills, negative weight loss Eyes: negative diplopia or visual changes, negative eye pain ENT: negative coryza, had sore throat last week, also had swelling of his tongue. Resp: negative cough, hemoptysis, dyspnea Cards: negative for chest pain, palpitations, lower extremity edema GU: negative for frequency, dysuria and hematuria GI: Negative for abdominal pain, diarrhea, bleeding, constipation Skin:  rash and pruritus Heme: negative for easy bruising and gum/nose bleeding MS:  myalgias, arthralgias, back pain and muscle weakness Neurolo:negative for headaches, dizziness, vertigo, memory problems  Psych: Anxiety Endocrine: No polyuria or polydipsia allergy/Immunology-Demerol Objective:  VITALS:  BP 136/80 (BP Location: Left Arm, Patient Position: Sitting, Cuff Size: Normal)   Pulse 99   Temp 98.6 F (37 C) (Oral)  PHYSICAL EXAM:  General: Alert, cooperative, no distress, appears stated age.  Head: Normocephalic, without obvious abnormality, atraumatic. Eyes: Conjunctivae clear, anicteric sclerae. Pupils are equal ENT Nares normal. No drainage or sinus tenderness. Lips, mucosa, and tongue normal. No Thrush Neck: Supple, symmetrical, no adenopathy, thyroid: non tender no carotid bruit and no JVD. Lungs: Clear to auscultation bilaterally. No Wheezing or Rhonchi. No rales. Heart: S1-S2 Abdomen: Did not examine as patient in wheelchair Extremities: Left foot dressing changed.   Minimal erythema.  No swelling Surgical site looks fine. Skin: Multiple excoriation in the arms. Lymph: Cervical, supraclavicular normal. Neurologic: Grossly non-focal Pertinent Labs No recent  labs     ? Impression/Recommendation Staph aureus bacteremia: With cellulitis and osteomyelitis of the left foot.  Underwent I&D septic second MTPJ  & Bone biopsy second metatarsal Surgical culture had no growth but it pathology showed osteomyelitis. Patient was sent home on IV cefazolin to rule see 6 weeks until 11/19/2018.  Developed a possible reaction to cefazolin as he had some swelling of his tongue and itching in his palms with a rash.  He also had a PICC line which was clotted.  So the PICC line was removed and over the weekend he was bridged on doxycycline p.o.  Today he is here to discuss his options.  He needs another 4 weeks of antibiotics.  We discussed IV vancomycin, IV daptomycin and p.o. linezolid. He did not want to do vancomycin because of fear of nephrotoxicity as brother had it. He is afraid of daptomycin because he has asthma and gets bronchitis and pneumonia easily  so does not want to risk  eosinophilic pneumonia. He is comfortable with p.o. linezolid.  All the side effects were discussed with him.  Including serotonin syndrome with food and other concurrent medications, bone marrow suppression, optic neuropathy and  lactic acidosis. He is not on any SSRIs.  He is not on any medications that would precipitate a serotonin syndrome. Advised diet to avoid tyramine rich food like aged cheese, [place], sauerkraut, red wine. As he is not taking metformin regularly asked him to hold it as risk for lactic acidosis with linezolid. We will prescribe p.o. linezolid 600 mg every 12 for 4 weeks. We will monitor CBC CMP weekly.  We will also check his ESR. With his next blood lab will do an RPR.    Osteogenesis imperfecta with history of fractures in the past  Diabetes mellitus on insulin pump. Last hemoglobin A1c was 9  Hyperlipidemia on rosuvastatin  Discussed the management with the patient?in detail ? ? ________________________________________ Note:  This document was  prepared using Dragon voice recognition software and may include unintentional dictation errors.

## 2018-10-24 NOTE — Patient Instructions (Addendum)
You re here for follow up- you were on cefazolin for staph bacteremia and left foot osteomyelitis. You were on cefazolin- and it caused some reaction- Also your PIC was blocked and it had to be removed We went thru the following options for treating the infection Vancomycin IV Daptomycin IV linezolid PO You chose the third option because you are worried about vancomycin nephrotoxicity, Daptomycin being IV and concern for pneumonia as you have asthma. linezolid is a PO medicine and we went thru all the side effects As you say you dont take metformin regularly will recommend not taking it while on this antibiotic- please follow diet rules to avoid highly fermented foods and too much coffee Please stop doxy .

## 2018-10-26 ENCOUNTER — Telehealth: Payer: Self-pay | Admitting: Infectious Diseases

## 2018-10-26 NOTE — Telephone Encounter (Signed)
Spoke to patient - He did not have any red urine since 2 am- No fever, abdominal or flank or urethral pain. No h/o renal stones in the past. Doubt this is related to linezolid - told him to observe closely

## 2018-10-26 NOTE — Telephone Encounter (Signed)
Patient called stating in the middle of the night he went to urinate and found blood while urinating.  Since then, he has increased water intake and can't tell that there's blood anymore.   Wants to know if he needs to go get labs done or if needs to be seen at office.   Thanks

## 2018-10-30 ENCOUNTER — Other Ambulatory Visit: Payer: Self-pay | Admitting: Infectious Diseases

## 2018-10-30 ENCOUNTER — Other Ambulatory Visit: Payer: Self-pay | Admitting: Licensed Clinical Social Worker

## 2018-10-30 ENCOUNTER — Telehealth: Payer: Self-pay | Admitting: Licensed Clinical Social Worker

## 2018-10-30 DIAGNOSIS — M86172 Other acute osteomyelitis, left ankle and foot: Secondary | ICD-10-CM

## 2018-10-30 DIAGNOSIS — R21 Rash and other nonspecific skin eruption: Secondary | ICD-10-CM

## 2018-10-30 DIAGNOSIS — E0800 Diabetes mellitus due to underlying condition with hyperosmolarity without nonketotic hyperglycemic-hyperosmolar coma (NKHHC): Secondary | ICD-10-CM

## 2018-10-30 DIAGNOSIS — Z794 Long term (current) use of insulin: Secondary | ICD-10-CM

## 2018-10-30 NOTE — Telephone Encounter (Signed)
I added RPR as well

## 2018-10-30 NOTE — Telephone Encounter (Signed)
Ok thank you 

## 2018-10-30 NOTE — Telephone Encounter (Signed)
Larena Glassman, RN with [facility] called stating that the patient was a difficult stick and was only able to collect urine sample today. Patient will come to the lab at [facility] today. I put lab orders in Epic.

## 2018-10-31 ENCOUNTER — Inpatient Hospital Stay: Payer: BC Managed Care – PPO | Admitting: Infectious Diseases

## 2018-10-31 ENCOUNTER — Other Ambulatory Visit
Admission: RE | Admit: 2018-10-31 | Discharge: 2018-10-31 | Disposition: A | Discharge status: Home / Self Care | Payer: BC Managed Care – PPO | Source: Ambulatory Visit | Attending: Infectious Diseases | Admitting: Infectious Diseases

## 2018-10-31 ENCOUNTER — Encounter: Payer: Self-pay | Admitting: Licensed Clinical Social Worker

## 2018-10-31 DIAGNOSIS — R21 Rash and other nonspecific skin eruption: Secondary | ICD-10-CM | POA: Diagnosis present

## 2018-10-31 DIAGNOSIS — Z794 Long term (current) use of insulin: Secondary | ICD-10-CM | POA: Diagnosis present

## 2018-10-31 DIAGNOSIS — M86172 Other acute osteomyelitis, left ankle and foot: Secondary | ICD-10-CM | POA: Insufficient documentation

## 2018-10-31 DIAGNOSIS — E0800 Diabetes mellitus due to underlying condition with hyperosmolarity without nonketotic hyperglycemic-hyperosmolar coma (NKHHC): Secondary | ICD-10-CM | POA: Diagnosis present

## 2018-10-31 LAB — CBC WITH DIFFERENTIAL/PLATELET
Abs Immature Granulocytes: 0.02 10*3/uL (ref 0.00–0.07)
Basophils Absolute: 0 10*3/uL (ref 0.0–0.1)
Basophils Relative: 1 %
Eosinophils Absolute: 0 10*3/uL (ref 0.0–0.5)
Eosinophils Relative: 1 %
HCT: 42.5 % (ref 39.0–52.0)
Hemoglobin: 15 g/dL (ref 13.0–17.0)
Immature Granulocytes: 0 %
Lymphocytes Relative: 22 %
Lymphs Abs: 1.3 10*3/uL (ref 0.7–4.0)
MCH: 30.6 pg (ref 26.0–34.0)
MCHC: 35.3 g/dL (ref 30.0–36.0)
MCV: 86.7 fL (ref 80.0–100.0)
Monocytes Absolute: 0.5 10*3/uL (ref 0.1–1.0)
Monocytes Relative: 9 %
Neutro Abs: 3.8 10*3/uL (ref 1.7–7.7)
Neutrophils Relative %: 67 %
Platelets: 345 10*3/uL (ref 150–400)
RBC: 4.9 MIL/uL (ref 4.22–5.81)
RDW: 13.1 % (ref 11.5–15.5)
WBC: 5.7 10*3/uL (ref 4.0–10.5)
nRBC: 0 % (ref 0.0–0.2)

## 2018-10-31 LAB — COMPREHENSIVE METABOLIC PANEL
ALT: 16 U/L (ref 0–44)
AST: 22 U/L (ref 15–41)
Albumin: 4.3 g/dL (ref 3.5–5.0)
Alkaline Phosphatase: 88 U/L (ref 38–126)
Anion gap: 9 (ref 5–15)
BUN: 17 mg/dL (ref 6–20)
CO2: 23 mmol/L (ref 22–32)
Calcium: 9.1 mg/dL (ref 8.9–10.3)
Chloride: 103 mmol/L (ref 98–111)
Creatinine, Ser: 1.2 mg/dL (ref 0.61–1.24)
GFR calc Af Amer: >60 mL/min (ref >60)
GFR calc non Af Amer: >60 mL/min (ref >60)
Glucose, Bld: 168 mg/dL — ABNORMAL HIGH (ref 70–99)
Potassium: 4.1 mmol/L (ref 3.5–5.1)
Sodium: 135 mmol/L (ref 135–145)
Total Bilirubin: 1 mg/dL (ref 0.3–1.2)
Total Protein: 7.9 g/dL (ref 6.5–8.1)

## 2018-10-31 LAB — HEMOGLOBIN A1C
Hgb A1c MFr Bld: 7.1 % — ABNORMAL HIGH (ref 4.8–5.6)
Mean Plasma Glucose: 157.07 mg/dL

## 2018-10-31 LAB — C-REACTIVE PROTEIN: CRP: <0.8 mg/dL (ref <1.0)

## 2018-10-31 LAB — SEDIMENTATION RATE: Sed Rate: 12 mm/hr (ref 0–20)

## 2018-11-01 ENCOUNTER — Telehealth: Payer: Self-pay | Admitting: Licensed Clinical Social Worker

## 2018-11-01 LAB — RPR: RPR Ser Ql: NONREACTIVE

## 2018-11-01 NOTE — Telephone Encounter (Signed)
Called patient to let him know that his RPR was negative and his urine was negative. Patient wanted to know if he needed to come get labs done again next week?

## 2018-11-06 ENCOUNTER — Other Ambulatory Visit
Admission: RE | Admit: 2018-11-06 | Discharge: 2018-11-06 | Disposition: A | Discharge status: Home / Self Care | Payer: BC Managed Care – PPO | Source: Ambulatory Visit | Attending: Infectious Diseases | Admitting: Infectious Diseases

## 2018-11-06 ENCOUNTER — Other Ambulatory Visit: Payer: Self-pay | Admitting: Licensed Clinical Social Worker

## 2018-11-06 DIAGNOSIS — M86172 Other acute osteomyelitis, left ankle and foot: Secondary | ICD-10-CM | POA: Insufficient documentation

## 2018-11-06 LAB — CBC WITH DIFFERENTIAL/PLATELET
Abs Immature Granulocytes: 0.01 10*3/uL (ref 0.00–0.07)
Basophils Absolute: 0 10*3/uL (ref 0.0–0.1)
Basophils Relative: 1 %
Eosinophils Absolute: 0.1 10*3/uL (ref 0.0–0.5)
Eosinophils Relative: 1 %
HCT: 40 % (ref 39.0–52.0)
Hemoglobin: 14.4 g/dL (ref 13.0–17.0)
Immature Granulocytes: 0 %
Lymphocytes Relative: 25 %
Lymphs Abs: 1.3 10*3/uL (ref 0.7–4.0)
MCH: 30.6 pg (ref 26.0–34.0)
MCHC: 36 g/dL (ref 30.0–36.0)
MCV: 84.9 fL (ref 80.0–100.0)
Monocytes Absolute: 0.4 10*3/uL (ref 0.1–1.0)
Monocytes Relative: 9 %
Neutro Abs: 3.2 10*3/uL (ref 1.7–7.7)
Neutrophils Relative %: 64 %
Platelets: 218 10*3/uL (ref 150–400)
RBC: 4.71 MIL/uL (ref 4.22–5.81)
RDW: 12.5 % (ref 11.5–15.5)
WBC: 5 10*3/uL (ref 4.0–10.5)
nRBC: 0 % (ref 0.0–0.2)

## 2018-11-06 LAB — SEDIMENTATION RATE: Sed Rate: 10 mm/hr (ref 0–20)

## 2018-11-06 LAB — C-REACTIVE PROTEIN: CRP: <0.8 mg/dL (ref <1.0)

## 2018-11-13 ENCOUNTER — Encounter: Payer: Self-pay | Admitting: Licensed Clinical Social Worker

## 2018-11-13 ENCOUNTER — Other Ambulatory Visit: Payer: Self-pay | Admitting: Licensed Clinical Social Worker

## 2018-11-13 DIAGNOSIS — M86172 Other acute osteomyelitis, left ankle and foot: Secondary | ICD-10-CM

## 2018-11-15 ENCOUNTER — Other Ambulatory Visit: Payer: Self-pay

## 2018-11-15 ENCOUNTER — Other Ambulatory Visit
Admission: RE | Admit: 2018-11-15 | Discharge: 2018-11-15 | Disposition: A | Discharge status: Home / Self Care | Payer: BC Managed Care – PPO | Source: Ambulatory Visit | Attending: Infectious Diseases | Admitting: Infectious Diseases

## 2018-11-15 DIAGNOSIS — M86172 Other acute osteomyelitis, left ankle and foot: Secondary | ICD-10-CM | POA: Insufficient documentation

## 2018-11-15 LAB — COMPREHENSIVE METABOLIC PANEL
ALT: 23 U/L (ref 0–44)
AST: 18 U/L (ref 15–41)
Albumin: 4 g/dL (ref 3.5–5.0)
Alkaline Phosphatase: 89 U/L (ref 38–126)
Anion gap: 11 (ref 5–15)
BUN: 21 mg/dL — ABNORMAL HIGH (ref 6–20)
CO2: 21 mmol/L — ABNORMAL LOW (ref 22–32)
Calcium: 8.5 mg/dL — ABNORMAL LOW (ref 8.9–10.3)
Chloride: 104 mmol/L (ref 98–111)
Creatinine, Ser: 1.08 mg/dL (ref 0.61–1.24)
GFR calc Af Amer: >60 mL/min (ref >60)
GFR calc non Af Amer: >60 mL/min (ref >60)
Glucose, Bld: 376 mg/dL — ABNORMAL HIGH (ref 70–99)
Potassium: 4.1 mmol/L (ref 3.5–5.1)
Sodium: 136 mmol/L (ref 135–145)
Total Bilirubin: 0.8 mg/dL (ref 0.3–1.2)
Total Protein: 6.7 g/dL (ref 6.5–8.1)

## 2018-11-15 LAB — CBC WITH DIFFERENTIAL/PLATELET
Abs Immature Granulocytes: 0.04 10*3/uL (ref 0.00–0.07)
Basophils Absolute: 0 10*3/uL (ref 0.0–0.1)
Basophils Relative: 0 %
Eosinophils Absolute: 0.3 10*3/uL (ref 0.0–0.5)
Eosinophils Relative: 6 %
HCT: 35 % — ABNORMAL LOW (ref 39.0–52.0)
Hemoglobin: 12.7 g/dL — ABNORMAL LOW (ref 13.0–17.0)
Immature Granulocytes: 1 %
Lymphocytes Relative: 24 %
Lymphs Abs: 1.4 10*3/uL (ref 0.7–4.0)
MCH: 30.5 pg (ref 26.0–34.0)
MCHC: 36.3 g/dL — ABNORMAL HIGH (ref 30.0–36.0)
MCV: 84.1 fL (ref 80.0–100.0)
Monocytes Absolute: 0.6 10*3/uL (ref 0.1–1.0)
Monocytes Relative: 9 %
Neutro Abs: 3.6 10*3/uL (ref 1.7–7.7)
Neutrophils Relative %: 60 %
Platelets: 186 10*3/uL (ref 150–400)
RBC: 4.16 MIL/uL — ABNORMAL LOW (ref 4.22–5.81)
RDW: 12.2 % (ref 11.5–15.5)
WBC: 6 10*3/uL (ref 4.0–10.5)
nRBC: 0 % (ref 0.0–0.2)

## 2018-11-20 ENCOUNTER — Other Ambulatory Visit: Payer: Self-pay | Admitting: Licensed Clinical Social Worker

## 2018-11-20 DIAGNOSIS — R350 Frequency of micturition: Secondary | ICD-10-CM

## 2018-11-20 NOTE — Progress Notes (Signed)
Patient called stating that he was having frequency and difficulty with urination. No fevers, last dose of antibiotics is tomorrow. Patient states that his glucose has been running normal to the low side. He has not had any fevers. Patient will come in for labs today or tomorrow. Patient agreed with plan.

## 2018-11-21 ENCOUNTER — Other Ambulatory Visit
Admission: RE | Admit: 2018-11-21 | Discharge: 2018-11-21 | Disposition: A | Discharge status: Home / Self Care | Payer: BC Managed Care – PPO | Source: Ambulatory Visit | Attending: Infectious Diseases | Admitting: Infectious Diseases

## 2018-11-21 ENCOUNTER — Telehealth: Payer: Self-pay | Admitting: Infectious Diseases

## 2018-11-21 DIAGNOSIS — M86172 Other acute osteomyelitis, left ankle and foot: Secondary | ICD-10-CM | POA: Diagnosis present

## 2018-11-21 DIAGNOSIS — R350 Frequency of micturition: Secondary | ICD-10-CM | POA: Diagnosis present

## 2018-11-21 LAB — COMPREHENSIVE METABOLIC PANEL
ALT: 22 U/L (ref 0–44)
AST: 21 U/L (ref 15–41)
Albumin: 4.6 g/dL (ref 3.5–5.0)
Alkaline Phosphatase: 79 U/L (ref 38–126)
Anion gap: 10 (ref 5–15)
BUN: 24 mg/dL — ABNORMAL HIGH (ref 6–20)
CO2: 21 mmol/L — ABNORMAL LOW (ref 22–32)
Calcium: 8.7 mg/dL — ABNORMAL LOW (ref 8.9–10.3)
Chloride: 105 mmol/L (ref 98–111)
Creatinine, Ser: 1 mg/dL (ref 0.61–1.24)
GFR calc Af Amer: >60 mL/min (ref >60)
GFR calc non Af Amer: >60 mL/min (ref >60)
Glucose, Bld: 178 mg/dL — ABNORMAL HIGH (ref 70–99)
Potassium: 4 mmol/L (ref 3.5–5.1)
Sodium: 136 mmol/L (ref 135–145)
Total Bilirubin: 0.8 mg/dL (ref 0.3–1.2)
Total Protein: 7.4 g/dL (ref 6.5–8.1)

## 2018-11-21 LAB — CBC WITH DIFFERENTIAL/PLATELET
Abs Immature Granulocytes: 0.02 10*3/uL (ref 0.00–0.07)
Basophils Absolute: 0 10*3/uL (ref 0.0–0.1)
Basophils Relative: 1 %
Eosinophils Absolute: 0.1 10*3/uL (ref 0.0–0.5)
Eosinophils Relative: 2 %
HCT: 37.5 % — ABNORMAL LOW (ref 39.0–52.0)
Hemoglobin: 13.2 g/dL (ref 13.0–17.0)
Immature Granulocytes: 0 %
Lymphocytes Relative: 25 %
Lymphs Abs: 1.3 10*3/uL (ref 0.7–4.0)
MCH: 30.3 pg (ref 26.0–34.0)
MCHC: 35.2 g/dL (ref 30.0–36.0)
MCV: 86.2 fL (ref 80.0–100.0)
Monocytes Absolute: 0.5 10*3/uL (ref 0.1–1.0)
Monocytes Relative: 10 %
Neutro Abs: 3.2 10*3/uL (ref 1.7–7.7)
Neutrophils Relative %: 62 %
Platelets: 249 10*3/uL (ref 150–400)
RBC: 4.35 MIL/uL (ref 4.22–5.81)
RDW: 13.3 % (ref 11.5–15.5)
WBC: 5.1 10*3/uL (ref 4.0–10.5)
nRBC: 0 % (ref 0.0–0.2)

## 2018-11-21 LAB — URINALYSIS, ROUTINE W REFLEX MICROSCOPIC
Bacteria, UA: NONE SEEN
Bilirubin Urine: NEGATIVE
Glucose, UA: >=500 mg/dL — AB
Hgb urine dipstick: NEGATIVE
Ketones, ur: NEGATIVE mg/dL
Leukocytes,Ua: NEGATIVE
Nitrite: NEGATIVE
Protein, ur: 30 mg/dL — AB
Specific Gravity, Urine: 1.028 (ref 1.005–1.030)
pH: 5 (ref 5.0–8.0)

## 2018-11-21 NOTE — Telephone Encounter (Signed)
Spoke to patient about his results and told him Urine did not how any evidence of infeciton- he has sugar ( lot) in the urine- He is on  insulin CBC/CMP oky- Blood glucose 178. Completed linezolid for left foot infection/MRSA bacteremia, Discussed with Dr.Fowler who siad his foot is doing very well.

## 2018-11-21 NOTE — Addendum Note (Signed)
Addended by: Santiago Bur on: 11/21/2018 09:26 AM   Modules accepted: Orders

## 2019-01-22 ENCOUNTER — Ambulatory Visit: Payer: Self-pay

## 2019-01-22 ENCOUNTER — Other Ambulatory Visit: Payer: Self-pay

## 2019-01-22 DIAGNOSIS — Z23 Encounter for immunization: Secondary | ICD-10-CM

## 2019-01-22 NOTE — Progress Notes (Signed)
     Patient ID: Damon Anderson, male    DOB: 02-Apr-1971, 48 y.o.   MRN: [id number]    Thank you!!  [place] Nurse Specialist [place]: [phone]  Cell:  [phone] Website: [email or web address]

## 2019-01-22 NOTE — Patient Instructions (Signed)
Influenza (Flu) Vaccine (Inactivated or Recombinant): What You Need to Know 1. Why get vaccinated? Influenza vaccine can prevent influenza (flu). Flu is a contagious disease that spreads around the [place] every year, usually between October and May. Anyone can get the flu, but it is more dangerous for some people. Infants and young children, people 50 years of age and older, pregnant women, and people with certain health conditions or a weakened immune system are at greatest risk of flu complications. Pneumonia, bronchitis, sinus infections and ear infections are examples of flu-related complications. If you have a medical condition, such as heart disease, cancer or diabetes, flu can make it worse. Flu can cause fever and chills, sore throat, muscle aches, fatigue, cough, headache, and runny or stuffy nose. Some people may have vomiting and diarrhea, though this is more common in children than adults. Each year thousands of people in the [place] die from flu, and many more are hospitalized. Flu vaccine prevents millions of illnesses and flu-related visits to the doctor each year. 2. Influenza vaccine CDC recommends everyone 6 months of age and older get vaccinated every flu season. Children 6 months through 8 years of age may need 2 doses during a single flu season. Everyone else needs only 1 dose each flu season. It takes about 2 weeks for protection to develop after vaccination. There are many flu viruses, and they are always changing. Each year a new flu vaccine is made to protect against three or four viruses that are likely to cause disease in the upcoming flu season. Even when the vaccine doesn't exactly match these viruses, it may still provide some protection. Influenza vaccine does not cause flu. Influenza vaccine may be given at the same time as other vaccines. 3. Talk with your health care provider Tell your vaccine provider if the person getting the vaccine:  Has had an  allergic reaction after a previous dose of influenza vaccine, or has any severe, life-threatening allergies.  Has ever had Guillain-Barr Syndrome (also called GBS). In some cases, your health care provider may decide to postpone influenza vaccination to a future visit. People with minor illnesses, such as a cold, may be vaccinated. People who are moderately or severely ill should usually wait until they recover before getting influenza vaccine. Your health care provider can give you more information. 4. Risks of a vaccine reaction  Soreness, redness, and swelling where shot is given, fever, muscle aches, and headache can happen after influenza vaccine.  There may be a very small increased risk of Guillain-Barr Syndrome (GBS) after inactivated influenza vaccine (the flu shot). Young children who get the flu shot along with pneumococcal vaccine (PCV13), and/or DTaP vaccine at the same time might be slightly more likely to have a seizure caused by fever. Tell your health care provider if a child who is getting flu vaccine has ever had a seizure. People sometimes faint after medical procedures, including vaccination. Tell your provider if you feel dizzy or have vision changes or ringing in the ears. As with any medicine, there is a very remote chance of a vaccine causing a severe allergic reaction, other serious injury, or death. 5. What if there is a serious problem? An allergic reaction could occur after the vaccinated person leaves the clinic. If you see signs of a severe allergic reaction (hives, swelling of the face and throat, difficulty breathing, a fast heartbeat, dizziness, or weakness), call 9-1-1 and get the person to the nearest hospital. For other signs that   concern you, call your health care provider. Adverse reactions should be reported to the Vaccine Adverse Event Reporting System (VAERS). Your health care provider will usually file this report, or you can do it yourself. Visit the  VAERS website at www.vaers.hhs.gov or call 1-800-822-7967.VAERS is only for reporting reactions, and VAERS staff do not give medical advice. 6. The National Vaccine Injury Compensation Program The National Vaccine Injury Compensation Program (VICP) is a federal program that was created to compensate people who may have been injured by certain vaccines. Visit the VICP website at www.hrsa.gov/vaccinecompensation or call 1-800-338-2382 to learn about the program and about filing a claim. There is a time limit to file a claim for compensation. 7. How can I learn more?  Ask your healthcare provider.  Call your local or state health department.  Contact the Centers for Disease Control and Prevention (CDC): ? Call 1-800-232-4636 (1-800-CDC-INFO) or ? Visit CDC's www.cdc.gov/flu Vaccine Information Statement (Interim) Inactivated Influenza Vaccine (12/08/2017) This information is not intended to replace advice given to you by your health care provider. Make sure you discuss any questions you have with your health care provider. Document Released: 02/04/2006 Document Revised: 08/01/2018 Document Reviewed: 12/12/2017 Elsevier Patient Education  2020 Elsevier Inc. Preventing Influenza, Adult Influenza, more commonly known as "the flu," is a viral infection that mainly affects the respiratory tract. The respiratory tract includes structures that help you breathe, such as the lungs, nose, and throat. The flu causes many common cold symptoms, as well as a high fever and body aches. The flu spreads easily from person to person (is contagious). The flu is most common from December through March. This is called flu season.You can catch the flu virus by:  Breathing in droplets from an infected person's cough or sneeze.  Touching something that was recently contaminated with the virus and then touching your mouth, nose, or eyes. What can I do to lower my risk?        You can decrease your risk of getting  the flu by:  Getting a flu shot (influenza vaccination) every year. This is the best way to prevent the flu. A flu shot is recommended for everyone age 6 months and older. ? It is best to get a flu shot in the fall, as soon as it is available. Getting a flu shot during winter or spring instead is still a good idea. Flu season can last into early spring. ? Preventing the flu through vaccination requires getting a new flu shot every year. This is because the flu virus changes slightly (mutates) from one year to the next. Even if a flu shot does not completely protect you from all flu virus mutations, it can reduce the severity of your illness and prevent dangerous complications of the flu. ? If you are pregnant, you can and should get a flu shot. ? If you have had a reaction to the shot in the past or if you are allergic to eggs, check with your health care provider before getting a flu shot. ? Sometimes the vaccine is available as a nasal spray. In some years, the nasal spray has not been as effective against the flu virus. Check with your health care provider if you have questions about this.  Practicing good health habits. This is especially important during flu season. ? Avoid contact with people who are sick with flu or cold symptoms. ? Wash your hands with soap and water often. If soap and water are not available, use alcohol-based   hand sanitizer. ? Avoid touching your hands to your face, especially when you have not washed your hands recently. ? Use a disinfectant to clean surfaces at home and at work that may be contaminated with the flu virus. ? Keep your body's disease-fighting system (immune system) in good shape by eating a healthy diet, drinking plenty of fluids, getting enough sleep, and exercising regularly. If you do get the flu, avoid spreading it to others by:  Staying home until your symptoms have been gone for at least one day.  Covering your mouth and nose when you cough or  sneeze.  Avoiding close contact with others, especially babies and elderly people. Why are these changes important? Getting a flu shot and practicing good health habits protects you as well as other people. If you get the flu, your friends, family, and co-workers are also at risk of getting it, because it spreads so easily to others. Each year, about 2 out of every 10 people get the flu. Having the flu can lead to complications, such as pneumonia, ear infection, and sinus infection. The flu also can be deadly, especially for babies, people older than age 50, and people who have serious long-term diseases. How is this treated? Most people recover from the flu by resting at home and drinking plenty of fluids. However, a prescription antiviral medicine may reduce your flu symptoms and may make your flu go away sooner. This medicine must be started within a few days of getting flu symptoms. You can talk with your health care provider about whether you need an antiviral medicine. Antiviral medicine may be prescribed for people who are at risk for more serious flu symptoms. This includes people who:  Are older than age 50.  Are pregnant.  Have a condition that makes the flu worse or more dangerous. Where to find more information  Centers for Disease Control and Prevention: www.cdc.gov/flu/index.htm  Flu.gov: www.flu.gov/prevention-vaccination  American Academy of Family Physicians: familydoctor.org/familydoctor/en/kids/vaccines/preventing-the-flu.html Contact a health care provider if:  You have influenza and you develop new symptoms.  You have: ? Chest pain. ? Diarrhea. ? A fever.  Your cough gets worse, or you produce more mucus. Summary  The best way to prevent the flu is to get a flu shot every year in the fall.  Even if you get the flu after you have received the yearly vaccine, your flu may be milder and go away sooner because of your flu shot.  If you get the flu, antiviral  medicines that are started with a few days of symptoms may reduce your flu symptoms and may make your flu go away sooner.  You can also help prevent the flu by practicing good health habits. This information is not intended to replace advice given to you by your health care provider. Make sure you discuss any questions you have with your health care provider. Document Released: 04/27/2015 Document Revised: 03/25/2017 Document Reviewed: 12/20/2015 Elsevier Patient Education  2020 Elsevier Inc.  

## 2019-07-06 ENCOUNTER — Ambulatory Visit: Payer: BC Managed Care – PPO | Attending: Internal Medicine

## 2019-07-06 DIAGNOSIS — Z23 Encounter for immunization: Secondary | ICD-10-CM

## 2019-07-06 NOTE — Progress Notes (Signed)
   Covid-19 Vaccination Clinic  Name:  Damon Anderson    MRN: [id number] DOB: 10-07-1970  07/06/2019  Damon Anderson was observed post Covid-19 immunization for 15 minutes without incident. He was provided with Vaccine Information Sheet and instruction to access the V-Safe system.   Damon Anderson was instructed to call 911 with any severe reactions post vaccine: Marland Kitchen Difficulty breathing  . Swelling of face and throat  . A fast heartbeat  . A bad rash all over body  . Dizziness and weakness   Immunizations Administered    Name Date Dose VIS Date Route   Pfizer COVID-19 Vaccine 07/06/2019 10:06 AM 0.3 mL 04/06/2019 Intramuscular   Manufacturer: [employer], [employer]   Lot: [id number]   NDC: [address]-3

## 2019-07-30 ENCOUNTER — Ambulatory Visit: Payer: BC Managed Care – PPO | Attending: Internal Medicine

## 2019-07-30 DIAGNOSIS — Z23 Encounter for immunization: Secondary | ICD-10-CM

## 2019-07-30 NOTE — Progress Notes (Signed)
   Covid-19 Vaccination Clinic  Name:  Damon Anderson    MRN: [id number] DOB: [birth year]  07/30/2019  [name] was observed post Covid-19 immunization for 15 minutes without incident. He was provided with Vaccine Information Sheet and instruction to access the V-Safe system.   [name] was instructed to call 911 with any severe reactions post vaccine: Marland Kitchen Difficulty breathing  . Swelling of face and throat  . A fast heartbeat  . A bad rash all over body  . Dizziness and weakness   Immunizations Administered    Name Date Dose VIS Date Route   Pfizer COVID-19 Vaccine 07/30/2019  1:54 PM 0.3 mL 04/06/2019 Intramuscular   Manufacturer: [employer], [employer]   Lot: [id number]   NDC: [address]-8

## 2020-04-09 ENCOUNTER — Encounter: Payer: Self-pay | Admitting: Emergency Medicine

## 2020-04-09 ENCOUNTER — Other Ambulatory Visit: Payer: Self-pay

## 2020-04-09 ENCOUNTER — Ambulatory Visit
Admission: EM | Admit: 2020-04-09 | Discharge: 2020-04-09 | Disposition: A | Discharge status: Home / Self Care | Payer: BC Managed Care – PPO | Attending: Family Medicine | Admitting: Family Medicine

## 2020-04-09 DIAGNOSIS — R300 Dysuria: Secondary | ICD-10-CM

## 2020-04-09 LAB — URINALYSIS, COMPLETE (UACMP) WITH MICROSCOPIC
Bilirubin Urine: NEGATIVE
Glucose, UA: 100 mg/dL — AB
Hgb urine dipstick: NEGATIVE
Ketones, ur: NEGATIVE mg/dL
Leukocytes,Ua: NEGATIVE
Nitrite: NEGATIVE
Protein, ur: NEGATIVE mg/dL
RBC / HPF: NONE SEEN RBC/hpf (ref 0–5)
Specific Gravity, Urine: 1.01 (ref 1.005–1.030)
pH: 7 (ref 5.0–8.0)

## 2020-04-09 LAB — CHLAMYDIA/NGC RT PCR (ARMC ONLY)
Chlamydia Tr: NOT DETECTED
N gonorrhoeae: NOT DETECTED

## 2020-04-09 NOTE — ED Triage Notes (Signed)
Patient in today c/o urinary frequency and dysuria x 2 days. Patient states sometimes is urine flow is normal and sometimes it is less. Patient also c/o pain at the base of the left testicle after urination. Patient denies penile discharge. Patient is also requesting STD testing.

## 2020-04-09 NOTE — Discharge Instructions (Addendum)
No evidence of UTI.   Monitor sugars.  Stay hydrated.  Take care   Dr. Adriana Simas

## 2020-04-09 NOTE — ED Provider Notes (Signed)
[facility]    CSN: [id number] Arrival date & time: 04/09/20  1536  History   Chief Complaint Chief Complaint  Patient presents with  . Urinary Frequency  . Dysuria   HPI  49 year old male presents with the above complaints.  Patient reports symptoms over the past 36 to 48 hours.  Reports some urinary frequency and mild dysuria.  He is also had some testicular discomfort after urination.  No penile discharge.  Patient requested STD testing as well today.  No abdominal pain.  No flank pain.  No back pain.  No relieving factors.  No other complaints.  Past Medical History:  Diagnosis Date  . Diabetes mellitus without complication (HCC)    Type 1 with insulin pump  . Hypertension     Patient Active Problem List   Diagnosis Date Noted  . Acute osteomyelitis of toe, left (HCC) 10/24/2018  . MSSA bacteremia 10/09/2018    Past Surgical History:  Procedure Laterality Date  . APPENDECTOMY    . ELBOW FRACTURE SURGERY Right   . INCISION AND DRAINAGE Left 10/11/2018   Procedure: INCISION AND DRAINAGE WITH BONE BIOPSY;  Surgeon: Gwyneth Revels, DPM;  Location: [facility] ORS;  Service: Podiatry;  Laterality: Left;  . JOINT REPLACEMENT Left   . WRIST ARTHROPLASTY Right    pinning       Home Medications    Prior to Admission medications   Medication Sig Start Date End Date Taking? Authorizing Provider  aspirin EC 81 MG tablet Take 81 mg by mouth daily. Swallow whole.   Yes [provider]  cetirizine (ZYRTEC) 10 MG tablet Take 10 mg by mouth daily.   Yes [provider]  fluticasone (FLONASE) 50 MCG/ACT nasal spray Place 1 spray into the nose daily.   Yes [provider]  insulin aspart (NOVOLOG) 100 UNIT/ML injection Inject 120 Units into the skin as directed.  01/20/18  Yes [provider]  insulin glargine (LANTUS) 100 UNIT/ML injection Inject 56 Units into the skin daily as needed (backup insulin therapy).  08/07/18 04/09/20 Yes  [provider]  lisinopril-hydrochlorothiazide (ZESTORETIC) 10-12.5 MG tablet Take 1 tablet by mouth daily for 30 days. 10/22/18 11/21/18 Yes Pia Mau M, PA-C  rosuvastatin (CRESTOR) 40 MG tablet Take 40 mg by mouth daily. 07/19/18  Yes [provider]  tadalafil (CIALIS) 20 MG tablet Take 1 tablet by mouth daily as needed. 03/10/20  Yes [provider]  diphenhydrAMINE (BENADRYL ALLERGY) 25 mg capsule Take 1 capsule (25 mg total) by mouth every 6 (six) hours as needed for up to 5 days. 10/22/18 04/09/20  Orvil Feil, PA-C  famotidine (PEPCID) 20 MG tablet Take 1 tablet (20 mg total) by mouth 2 (two) times daily for 7 days. 10/22/18 04/09/20  Orvil Feil, PA-C  metFORMIN (GLUCOPHAGE-XR) 500 MG 24 hr tablet Take 500 mg by mouth daily with supper. 07/19/18 04/09/20  [provider]    Family History Family History  Problem Relation Age of Onset  . Diabetes Mother   . Hypertension Mother   . Hypertension Father   . Stroke Father     Social History Social History   Tobacco Use  . Smoking status: Former Smoker    Quit date: 01/10/2018    Years since quitting: 2.2  . Smokeless tobacco: Never Used  Vaping Use  . Vaping Use: Never used  Substance Use Topics  . Alcohol use: Never  . Drug use: Never     Allergies  Cefazolin and Demerol [meperidine hcl]   Review of Systems Review of Systems  Gastrointestinal: Negative.   Genitourinary: Positive for dysuria and frequency.   Physical Exam Triage Vital Signs ED Triage Vitals  Enc Vitals Group     BP 04/09/20 1547 134/86     Pulse Rate 04/09/20 1547 99     Resp 04/09/20 1547 18     Temp 04/09/20 1547 98.4 F (36.9 C)     Temp Source 04/09/20 1547 Oral     SpO2 04/09/20 1547 100 %     Weight 04/09/20 1548 212 lb (96.2 kg)     Height 04/09/20 1548 5\' 7"  (1.702 m)     Head Circumference --      Peak Flow --      Pain Score 04/09/20 1547 2     Pain Loc --      Pain Edu? --       Excl. in GC? --    No data found.  Updated Vital Signs BP 134/86 (BP Location: Left Arm)   Pulse 99   Temp 98.4 F (36.9 C) (Oral)   Resp 18   Ht 5\' 7"  (1.702 m)   Wt 96.2 kg   SpO2 100%   BMI 33.20 kg/m   Visual Acuity Right Eye Distance:   Left Eye Distance:   Bilateral Distance:    Right Eye Near:   Left Eye Near:    Bilateral Near:     Physical Exam Constitutional:      General: He is not in acute distress.    Appearance: Normal appearance. He is not ill-appearing.  HENT:     Head: Normocephalic and atraumatic.  Cardiovascular:     Rate and Rhythm: Normal rate and regular rhythm.     Heart sounds: No murmur heard.   Pulmonary:     Effort: Pulmonary effort is normal.     Breath sounds: Normal breath sounds. No wheezing, rhonchi or rales.  Abdominal:     General: There is no distension.     Palpations: Abdomen is soft.     Tenderness: There is no abdominal tenderness.  Neurological:     Mental Status: He is alert.  Psychiatric:        Mood and Affect: Mood normal.        Behavior: Behavior normal.    UC Treatments / Results  Labs (all labs ordered are listed, but only abnormal results are displayed) Labs Reviewed  URINALYSIS, COMPLETE (UACMP) WITH MICROSCOPIC - Abnormal; Notable for the following components:      Result Value   Glucose, UA 100 (*)    Bacteria, UA RARE (*)    All other components within normal limits  CHLAMYDIA/NGC RT PCR [facility] ONLY)    EKG   Radiology No results found.  Procedures Procedures (including critical care time)  Medications Ordered in UC Medications - No data to display  Initial Impression / Assessment and Plan / UC Course  I have reviewed the triage vital signs and the nursing notes.  Pertinent labs & imaging results that were available during my care of the patient were reviewed by me and considered in my medical decision making (see chart for details).    49 year old male presents with dysuria.   Urinalysis notable for glucose.  No other abnormalities.  Advised to monitor sugars.  Stay hydrated.  Supportive care.  Final Clinical Impressions(s) / UC Diagnoses   Final diagnoses:  Dysuria     Discharge Instructions  No evidence of UTI.   Monitor sugars.  Stay hydrated.  Take care   Dr. Adriana Simas    ED Prescriptions    None     PDMP not reviewed this encounter.   Tommie Sams, [place] 04/09/20 1652

## 2020-07-15 IMAGING — CR LEFT FOOT - COMPLETE 3+ VIEW
3 series · 3 of 3 positions shown · non-contrast
Comparison: None.

CLINICAL DATA: Left foot pain and erythema. Osteogenesis
imperfecta.

EXAM:
LEFT FOOT - COMPLETE 3+ VIEW

[foot ap]
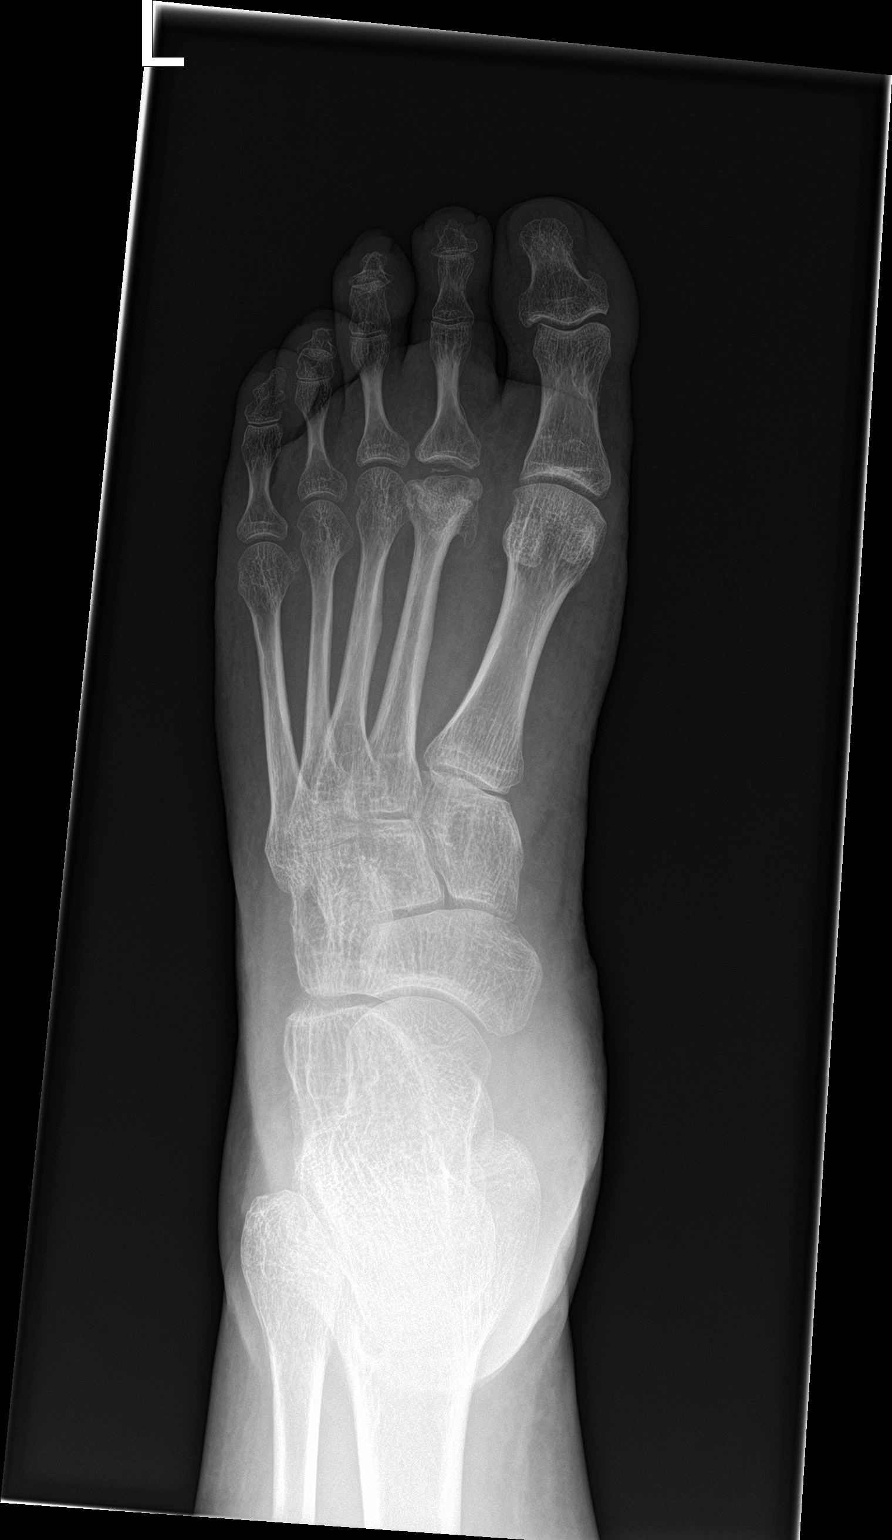

[foot obl]
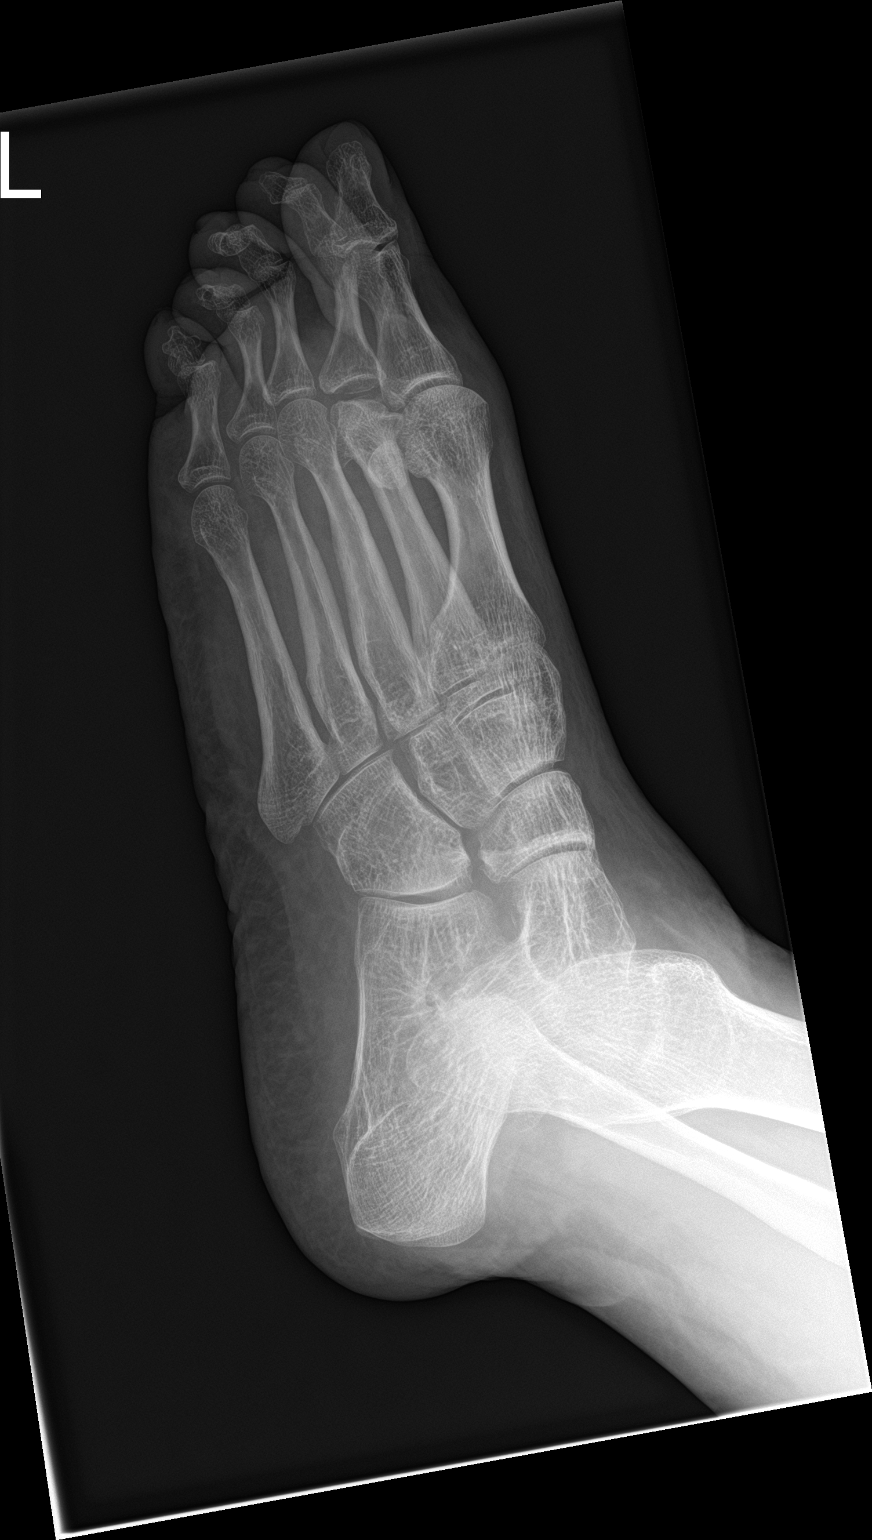

[foot lat]
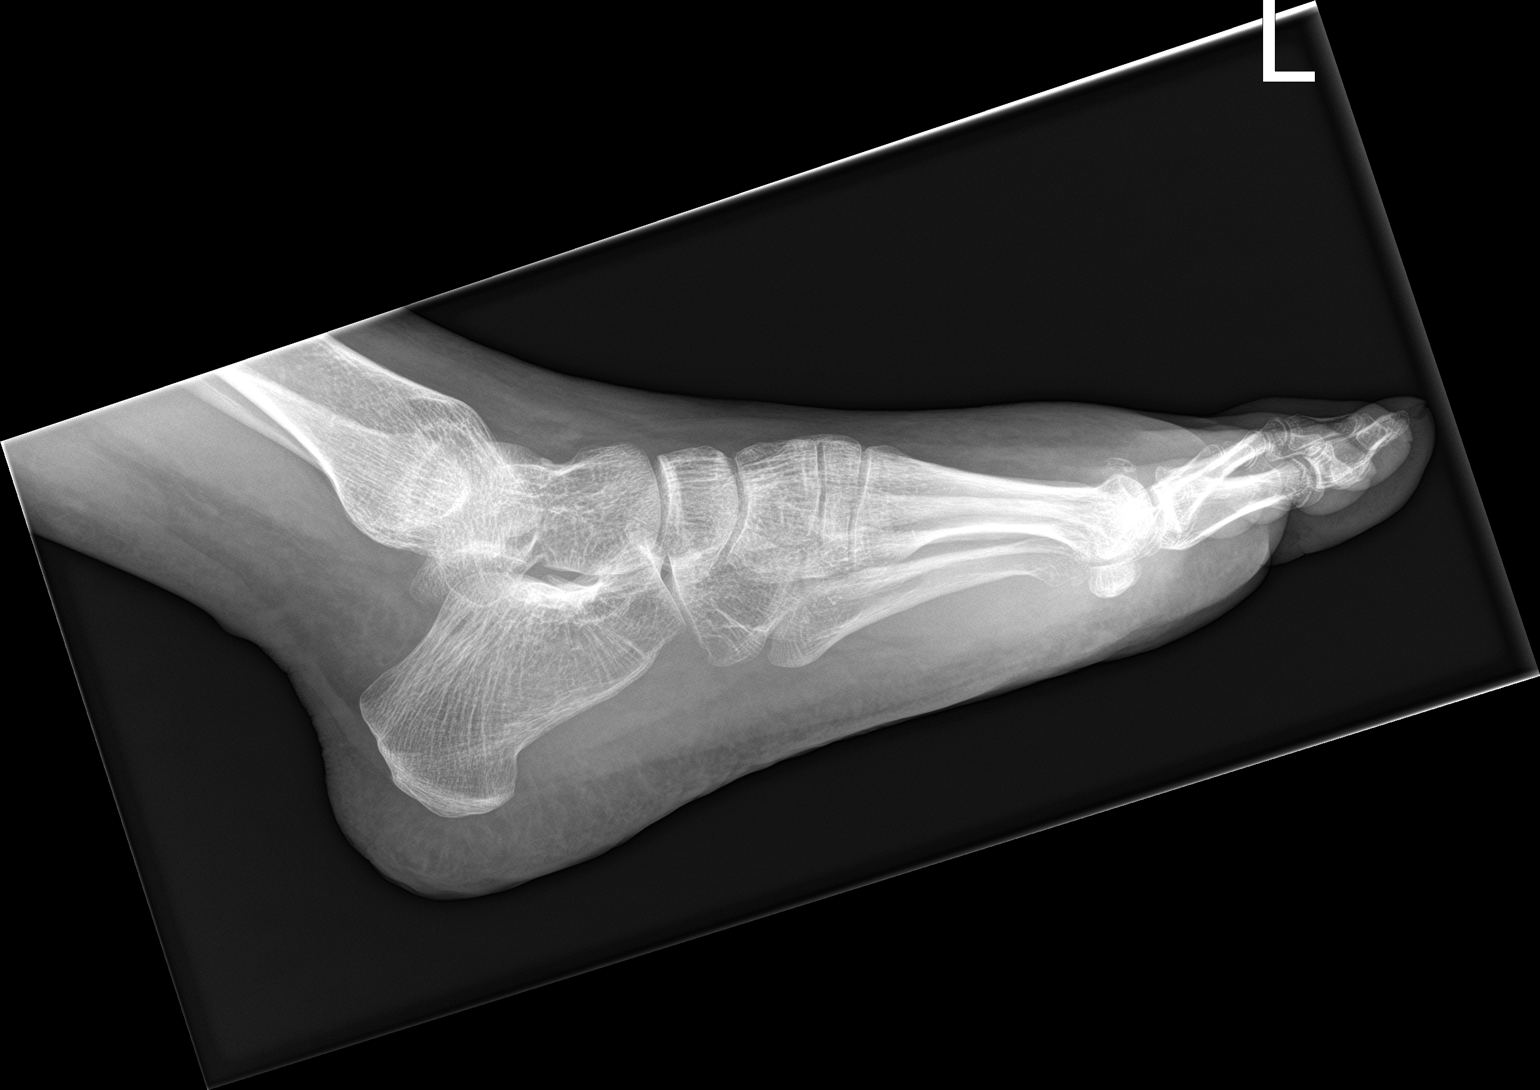

[3 of 3 positions shown; findings below may reference images not displayed]

FINDINGS: There is no evidence of acute fracture or dislocation. Generalized
osteopenia is noted. No focal lytic or sclerotic bone lesions
identified.

Degenerative spurring and chronic deformity of articular surface of
2nd metatarsal head noted. Mild soft tissue swelling is seen along
the dorsal aspect of the distal metacarpals. No evidence of soft
tissue gas or radiopaque foreign body.
IMPRESSION: Mild dorsal soft tissue swelling. No acute osseous abnormality.

## 2020-07-17 IMAGING — MR MRI OF THE LEFT FOREFOOT WITHOUT AND WITH CONTRAST
11 series · 40 of 40 positions shown · IV contrast (gadavist)
Comparison: Radiograph 10/08/2018

CLINICAL DATA: Pain and swelling of the right foot.

EXAM:
MRI OF THE LEFT FOREFOOT WITHOUT AND WITH CONTRAST
TECHNIQUE: Multiplanar, multisequence MR imaging of the right foot was
performed both before and after administration of intravenous
contrast.
CONTRAST:  10 cc Gadavist

[Series 5: T1 · coronal · left · 3.0mm · 0.38mm/px · 6 of 47 slices shown (1 of 2)]
[im 1/47]
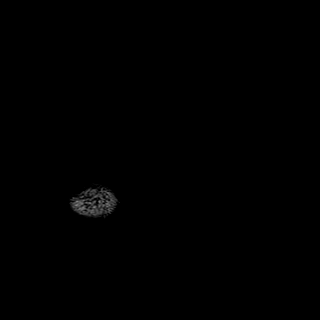
[im 10/47]
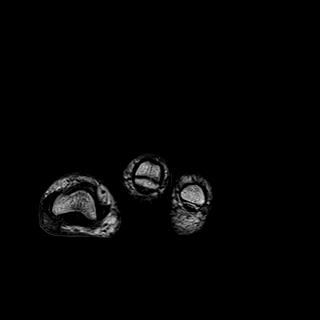
[im 19/47]
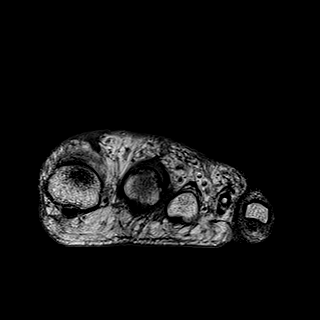
[im 28/47]
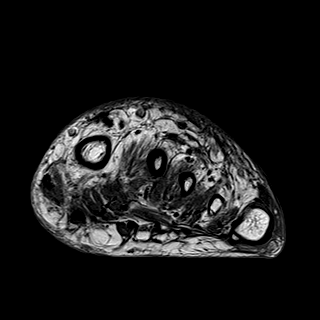
[im 37/47]
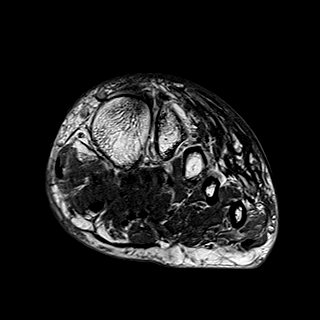
[im 47/47]
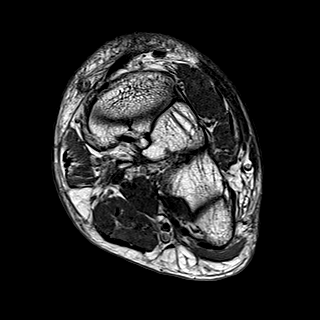

[Series 6: T2 · coronal · left · 3.0mm · 0.50mm/px · 5 of 47 slices shown (1 of 4)]
[im 1/47]
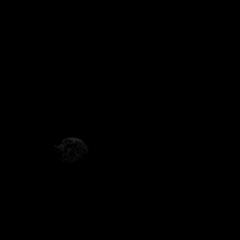
[im 12/47]
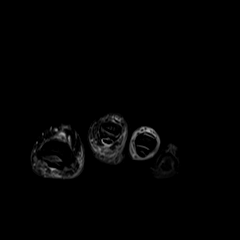
[im 24/47]
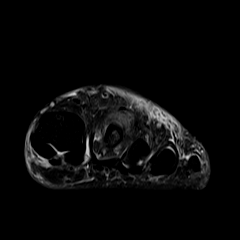
[im 35/47]
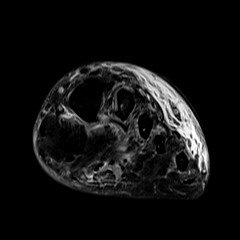
[im 47/47]
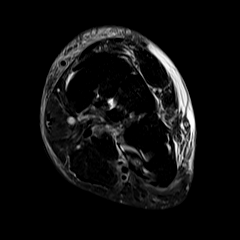

[Series 7: T2 · coronal · left · 3.0mm · 0.50mm/px · 5 of 47 slices shown (2 of 4)]
[im 1/47]
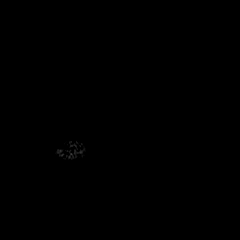
[im 12/47]
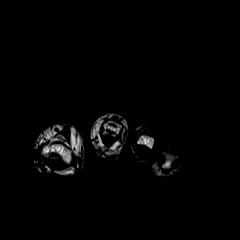
[im 24/47]
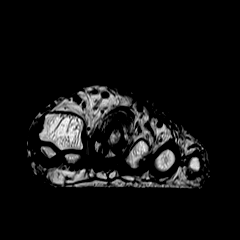
[im 35/47]
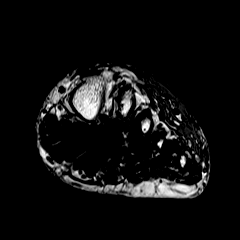
[im 47/47]
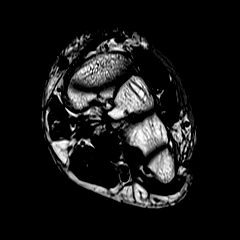

[Series 8: T1 · axial · left · 3.0mm · 0.70mm/px · z∈[-103,-20]mm · 2 of 22 slices shown (2 of 2)]
[im 1/22]
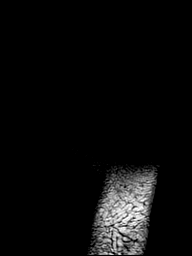
[im 22/22]
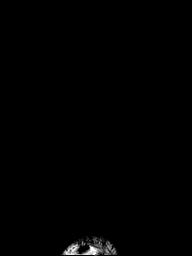

[Series 9: T2 · axial · left · 3.0mm · 0.70mm/px · z∈[-103,-20]mm · 2 of 22 slices shown (3 of 4)]
[im 1/22]
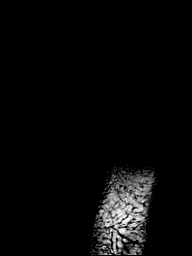
[im 22/22]
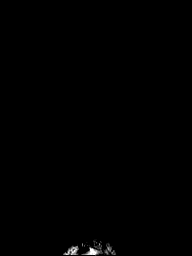

[Series 10: T2 · axial · left · 3.0mm · 0.70mm/px · z∈[-103,-20]mm · 2 of 22 slices shown (4 of 4)]
[im 1/22]
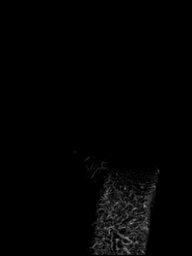
[im 22/22]
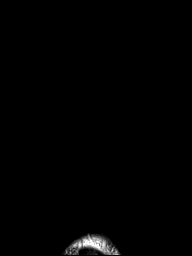

[Series 11: STIR · sagittal · left · 3.0mm · 0.62mm/px · 3 of 29 slices shown]
[im 1/29]
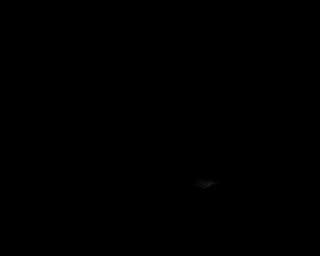
[im 15/29]
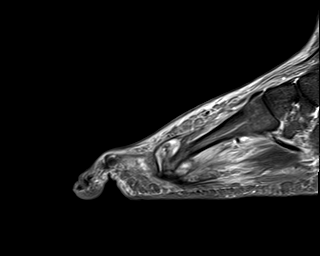
[im 29/29]
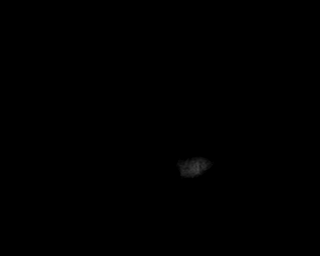

[Series 12: T1 fat-sat · coronal · non-contrast · left · 3.0mm · 0.38mm/px · 5 of 47 slices shown]
[im 1/47]
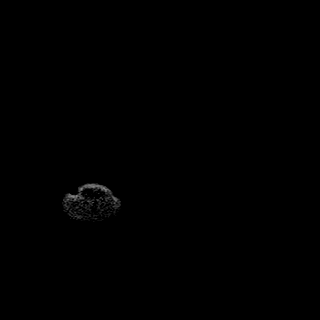
[im 12/47]
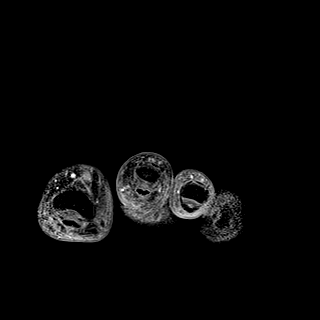
[im 24/47]
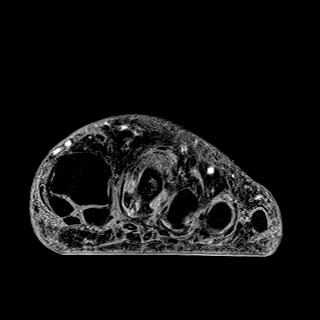
[im 35/47]
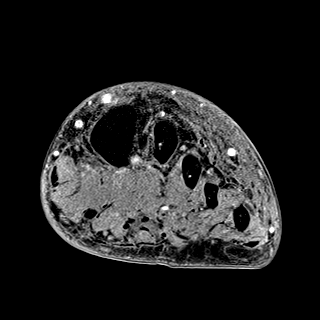
[im 47/47]
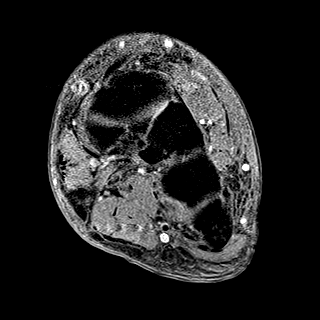

[Series 13: T1 fat-sat post-contrast · coronal · left · 3.0mm · 0.38mm/px · 5 of 47 slices shown (1 of 3)]
[im 1/47]
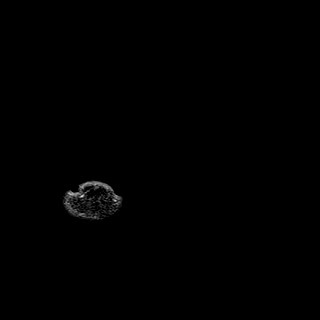
[im 12/47]
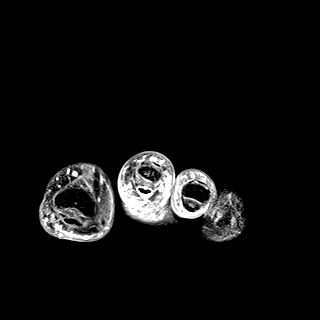
[im 24/47]
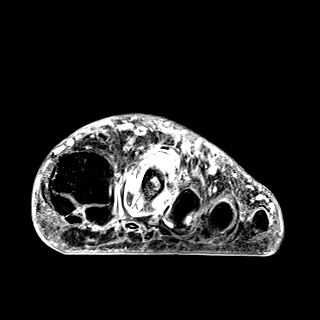
[im 35/47]
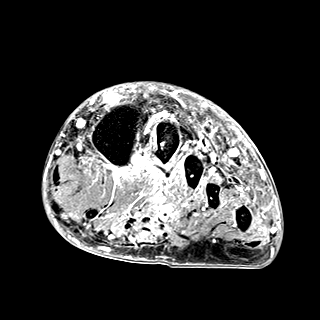
[im 47/47]
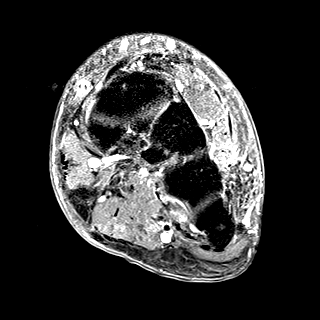

[Series 14: T1 fat-sat post-contrast · sagittal · left · 3.0mm · 0.62mm/px · 3 of 24 slices shown (2 of 3)]
[im 1/24]
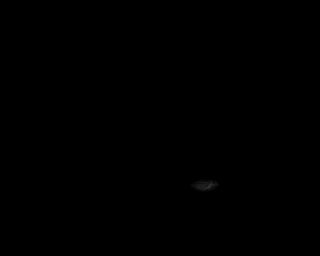
[im 12/24]
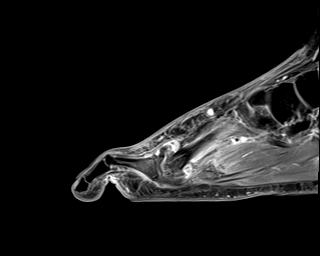
[im 24/24]
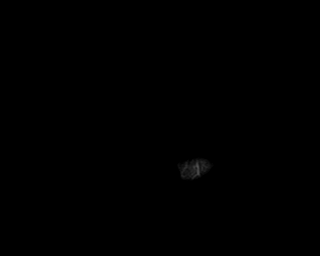

[Series 15: T1 fat-sat post-contrast · axial · left · 3.0mm · 0.70mm/px · z∈[-103,-20]mm · 2 of 22 slices shown (3 of 3)]
[im 1/22]
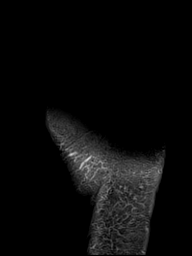
[im 22/22]
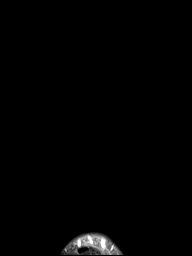

[40 of 40 positions shown; findings below may reference images not displayed]

FINDINGS: As demonstrated on the prior plain films there is advanced collapse
of the second metatarsal head along with spurring changes. This has
the appearance of Freiberg's infraction. However, there is a
significant arthropathic process at the second MTP joint with a
large complex joint effusion, synovitis and surrounding soft tissue
enhancement. There is also abnormal T1 and T2 signal intensity in
both the second metatarsal head and in the proximal phalanx. These
areas subsequently show contrast enhancement. Suspect septic
arthritis and osteomyelitis.

The other bony structures are intact. There are changes of diffuse
cellulitis and myofasciitis. No discrete drainable soft tissue
abscess or pyomyositis.
IMPRESSION: 1. Suspect remote changes of Freiberg's infraction involving the
second metatarsal head with superimposed septic arthritis and
osteomyelitis in and around the second MTP joint. Joint aspiration
is suggested.
2. Cellulitis and myofasciitis without findings for discrete
drainable soft tissue abscess or pyomyositis.

## 2020-10-24 ENCOUNTER — Emergency Department
Admission: EM | Admit: 2020-10-24 | Discharge: 2020-10-24 | Disposition: A | Discharge status: Home / Self Care | Payer: BC Managed Care – PPO | Attending: Emergency Medicine | Admitting: Emergency Medicine

## 2020-10-24 ENCOUNTER — Emergency Department: Payer: BC Managed Care – PPO

## 2020-10-24 ENCOUNTER — Other Ambulatory Visit: Payer: Self-pay

## 2020-10-24 DIAGNOSIS — S51851A Open bite of right forearm, initial encounter: Secondary | ICD-10-CM | POA: Diagnosis not present

## 2020-10-24 DIAGNOSIS — W5501XA Bitten by cat, initial encounter: Secondary | ICD-10-CM | POA: Diagnosis not present

## 2020-10-24 DIAGNOSIS — Z7982 Long term (current) use of aspirin: Secondary | ICD-10-CM | POA: Insufficient documentation

## 2020-10-24 DIAGNOSIS — I10 Essential (primary) hypertension: Secondary | ICD-10-CM | POA: Diagnosis not present

## 2020-10-24 DIAGNOSIS — Z87891 Personal history of nicotine dependence: Secondary | ICD-10-CM | POA: Insufficient documentation

## 2020-10-24 DIAGNOSIS — Z23 Encounter for immunization: Secondary | ICD-10-CM | POA: Diagnosis not present

## 2020-10-24 DIAGNOSIS — S59911A Unspecified injury of right forearm, initial encounter: Secondary | ICD-10-CM | POA: Diagnosis present

## 2020-10-24 DIAGNOSIS — E109 Type 1 diabetes mellitus without complications: Secondary | ICD-10-CM | POA: Insufficient documentation

## 2020-10-24 DIAGNOSIS — Z7984 Long term (current) use of oral hypoglycemic drugs: Secondary | ICD-10-CM | POA: Diagnosis not present

## 2020-10-24 DIAGNOSIS — Z203 Contact with and (suspected) exposure to rabies: Secondary | ICD-10-CM | POA: Diagnosis not present

## 2020-10-24 DIAGNOSIS — Z2914 Encounter for prophylactic rabies immune globin: Secondary | ICD-10-CM | POA: Insufficient documentation

## 2020-10-24 DIAGNOSIS — W5503XA Scratched by cat, initial encounter: Secondary | ICD-10-CM

## 2020-10-24 DIAGNOSIS — Z79899 Other long term (current) drug therapy: Secondary | ICD-10-CM | POA: Diagnosis not present

## 2020-10-24 MED ORDER — RABIES IMMUNE GLOBULIN 150 UNIT/ML IM INJ
20.0000 [IU]/kg | INJECTION | Freq: Once | INTRAMUSCULAR | Status: AC
  Administered 2020-10-24: 1950 [IU] via INTRAMUSCULAR
  Filled 2020-10-24 (×2): qty 13

## 2020-10-24 MED ORDER — SODIUM CHLORIDE 0.9 % IV SOLN
3.0000 g | Freq: Once | INTRAVENOUS | Status: AC
  Administered 2020-10-24: 3 g via INTRAVENOUS
  Filled 2020-10-24: qty 8

## 2020-10-24 MED ORDER — RABIES VACCINE, PCEC IM SUSR
1.0000 mL | Freq: Once | INTRAMUSCULAR | Status: AC
  Administered 2020-10-24: 1 mL via INTRAMUSCULAR
  Filled 2020-10-24: qty 1

## 2020-10-24 MED ORDER — AMOXICILLIN-POT CLAVULANATE 875-125 MG PO TABS: 1.0000 | ORAL_TABLET | Freq: Two times a day (BID) | ORAL | 0 refills | Status: AC

## 2020-10-24 MED ORDER — TETANUS-DIPHTH-ACELL PERTUSSIS 5-2.5-18.5 LF-MCG/0.5 IM SUSY
0.5000 mL | PREFILLED_SYRINGE | Freq: Once | INTRAMUSCULAR | Status: AC
  Administered 2020-10-24: 0.5 mL via INTRAMUSCULAR
  Filled 2020-10-24: qty 0.5

## 2020-10-24 NOTE — Discharge Instructions (Addendum)
Please return on July 4th, 8th and 15th for subsequent rabies shots.  Take Augmentin twice daily for ten days.

## 2020-10-24 NOTE — ED Notes (Signed)
Called about Rabies Vaccine to pharmacy and they had to track it down and they will send it dow  Unknown eta

## 2020-10-24 NOTE — ED Triage Notes (Signed)
Pt to ER via [facility]. Pt was petting a neighborhood cat when it got spooked and attacked his right arm. Pt with multiple wounds to right forearm.  Unsure of last tetanus shot.

## 2020-10-24 NOTE — ED Provider Notes (Signed)
[facility]-EMERGENCY DEPARTMENT  ____________________________________________  Time seen: Approximately 9:40 PM  I have reviewed the triage vital signs and the nursing notes.   HISTORY  Chief Complaint Animal Bite   Historian Patient     HPI Leeandre Nordling is a 50 y.o. male with a history of diabetes and hypertension presents to the emergency department after he was scratched and bitten by a cat.  Patient reports that cat is a stray in his neighborhood and is not available to be quarantined for signs and symptoms of rabies.  Patient has pain along the right forearm but no numbness or tingling.  He cannot recall his last tetanus shot.  Patient reports that he has had previous cutaneous infections in the past requiring IV antibiotics and is concerned that he might need IV antibiotics tonight.  No other alleviating measures have been attempted.   Past Medical History:  Diagnosis Date   Diabetes mellitus without complication (HCC)    Type 1 with insulin pump   Hypertension      Immunizations up to date:  Yes.     Past Medical History:  Diagnosis Date   Diabetes mellitus without complication (HCC)    Type 1 with insulin pump   Hypertension     Patient Active Problem List   Diagnosis Date Noted   Acute osteomyelitis of toe, left (HCC) 10/24/2018   MSSA bacteremia 10/09/2018    Past Surgical History:  Procedure Laterality Date   APPENDECTOMY     ELBOW FRACTURE SURGERY Right    INCISION AND DRAINAGE Left 10/11/2018   Procedure: INCISION AND DRAINAGE WITH BONE BIOPSY;  Surgeon: Gwyneth Revels, DPM;  Location: [facility] ORS;  Service: Podiatry;  Laterality: Left;   JOINT REPLACEMENT Left    WRIST ARTHROPLASTY Right    pinning    Prior to Admission medications   Medication Sig Start Date End Date Taking? Authorizing Provider  amoxicillin-clavulanate (AUGMENTIN) 875-125 MG tablet Take 1 tablet by mouth 2 (two) times daily for 10 days. 10/24/20 11/03/20 Yes Orvil Feil, PA-C   aspirin EC 81 MG tablet Take 81 mg by mouth daily. Swallow whole.    [provider]  cetirizine (ZYRTEC) 10 MG tablet Take 10 mg by mouth daily.    [provider]  fluticasone (FLONASE) 50 MCG/ACT nasal spray Place 1 spray into the nose daily.    [provider]  insulin aspart (NOVOLOG) 100 UNIT/ML injection Inject 120 Units into the skin as directed.  01/20/18   [provider]  insulin glargine (LANTUS) 100 UNIT/ML injection Inject 56 Units into the skin daily as needed (backup insulin therapy).  08/07/18 04/09/20  [provider]  lisinopril-hydrochlorothiazide (ZESTORETIC) 10-12.5 MG tablet Take 1 tablet by mouth daily for 30 days. 10/22/18 11/21/18  Orvil Feil, PA-C  rosuvastatin (CRESTOR) 40 MG tablet Take 40 mg by mouth daily. 07/19/18   [provider]  tadalafil (CIALIS) 20 MG tablet Take 1 tablet by mouth daily as needed. 03/10/20   [provider]  diphenhydrAMINE (BENADRYL ALLERGY) 25 mg capsule Take 1 capsule (25 mg total) by mouth every 6 (six) hours as needed for up to 5 days. 10/22/18 04/09/20  Orvil Feil, PA-C  famotidine (PEPCID) 20 MG tablet Take 1 tablet (20 mg total) by mouth 2 (two) times daily for 7 days. 10/22/18 04/09/20  Orvil Feil, PA-C  metFORMIN (GLUCOPHAGE-XR) 500 MG 24 hr tablet Take 500 mg by mouth daily with supper. 07/19/18 04/09/20  [provider]  Allergies Cefazolin and Demerol [meperidine hcl]  Family History  Problem Relation Age of Onset   Diabetes Mother    Hypertension Mother    Hypertension Father    Stroke Father     Social History Social History   Tobacco Use   Smoking status: Former    Pack years: 0.00    Types: Cigarettes    Quit date: 01/10/2018    Years since quitting: 2.7   Smokeless tobacco: Never  Vaping Use   Vaping Use: Never used  Substance Use Topics   Alcohol use: Never   Drug use: Never     Review of Systems  Constitutional: No  fever/chills Eyes:  No discharge ENT: No upper respiratory complaints. Respiratory: no cough. No SOB/ use of accessory muscles to breath Gastrointestinal:   No nausea, no vomiting.  No diarrhea.  No constipation. Musculoskeletal: Patient has right forearm pain.  Skin: Negative for rash, abrasions, lacerations, ecchymosis.    ____________________________________________   PHYSICAL EXAM:  VITAL SIGNS: ED Triage Vitals [10/24/20 1803]  Enc Vitals Group     BP (!) 157/75     Pulse Rate (!) 106     Resp 16     Temp 98.5 F (36.9 C)     Temp Source Oral     SpO2 98 %     Weight 215 lb (97.5 kg)     Height 5\' 7"  (1.702 m)     Head Circumference      Peak Flow      Pain Score 6     Pain Loc      Pain Edu?      Excl. in GC?      Constitutional: Alert and oriented. Well appearing and in no acute distress. Eyes: Conjunctivae are normal. PERRL. EOMI. Head: Atraumatic. ENT:      Nose: No congestion/rhinnorhea.      Mouth/Throat: Mucous membranes are moist.  Neck: No stridor.  No cervical spine tenderness to palpation. Cardiovascular: Normal rate, regular rhythm. Normal S1 and S2.  Good peripheral circulation. Respiratory: Normal respiratory effort without tachypnea or retractions. Lungs CTAB. Good air entry to the bases with no decreased or absent breath sounds Gastrointestinal: Bowel sounds x 4 quadrants. Soft and nontender to palpation. No guarding or rigidity. No distention. Musculoskeletal: Full range of motion to all extremities. No obvious deformities noted.  Palpable radial ulnar pulses bilaterally and symmetrically.  Capillary refill less than 2 seconds. Neurologic:  Normal for age. No gross focal neurologic deficits are appreciated.  Skin: Patient has scratches on the right forearm along with several small puncture wounds which could be a scratch or bite wounds. Psychiatric: Mood and affect are normal for age. Speech and behavior are normal.    ____________________________________________   LABS (all labs ordered are listed, but only abnormal results are displayed)  Labs Reviewed - No data to display ____________________________________________  EKG   ____________________________________________  RADIOLOGY , personally viewed and evaluated these images (plain radiographs) as part of my medical decision making, as well as reviewing the written report by the radiologist.  DG Forearm Right  Result Date: 10/24/2020 CLINICAL DATA:  Cat bite, multiple wounds. EXAM: RIGHT FOREARM - 2 VIEW COMPARISON:  None. FINDINGS: Some mild soft tissue irregularity along the proximal volar forearm soft tissues of the could reflect laceration soft tissue injury. Additional discrete sites of puncture wound and/or laceration of the right forearm are not well visualized radiographically. Diffuse mild soft tissue swelling along the  volar tissues. No soft tissue gas or retained foreign body. Remote posttraumatic deformity of the radius and ulna with postsurgical changes or prior ORIF of the proximal ulna secured by a partially threaded screw. Degenerative changes of the wrist and elbow without gross traumatic malalignment. IMPRESSION: Soft tissue irregularity along the proximal volar soft tissues of the forearm, correlate for focal site of laceration or soft tissue injury. Additional sites of puncture soft tissue injury not well visualized radiographically. No soft tissue gas or retained foreign body. No acute or worrisome osseous abnormality. The posttraumatic deformities of the radius and ulna with prior proximal ulnar ORIF. Electronically Signed   By: Kreg Shropshire M.D.   On: 10/24/2020 19:51    ____________________________________________    PROCEDURES  Procedure(s) performed:     Procedures     Medications  Ampicillin-Sulbactam (UNASYN) 3 g in sodium chloride 0.9 % 100 mL IVPB (3 g Intravenous New Bag/Given 10/24/20 2139)   rabies vaccine (RABAVERT) injection 1 mL (1 mL Intramuscular Given 10/24/20 2234)  rabies immune globulin (HYPERAB/KEDRAB) injection 1,950 Units (1,950 Units Intramuscular Given 10/24/20 2236)  Tdap (BOOSTRIX) injection 0.5 mL (0.5 mLs Intramuscular Given 10/24/20 2142)     ____________________________________________   INITIAL IMPRESSION / ASSESSMENT AND PLAN / ED COURSE  Pertinent labs & imaging results that were available during my care of the patient were reviewed by me and considered in my medical decision making (see chart for details).      Assessment and plan Cat scratch 50 year old male presents to the emergency department with scratch wounds along the right forearm and possible bite marks.  Patient was tachycardic at triage and hypertensive but vital signs were otherwise reassuring.  X-ray of the right forearm showed no retained foreign bodies.  Patient was given IV Unasyn and started the rabies vaccine series.  He was advised to return to the emergency department 3, 7 and 14 days from today for subsequent rabies shots.  He was started on Augmentin twice daily for the next 10 days and his tetanus status was updated in the emergency department.  He was cautioned to return to the emergency department if redness or streaking surrounded the cat scratch/bite wounds.  Patient was advised to report incident to [facility] police.  All patient questions were answered.     ____________________________________________  FINAL CLINICAL IMPRESSION(S) / ED DIAGNOSES  Final diagnoses:  Cat bite, initial encounter  Cat scratch      NEW MEDICATIONS STARTED DURING THIS VISIT:  ED Discharge Orders          Ordered    amoxicillin-clavulanate (AUGMENTIN) 875-125 MG tablet  2 times daily        10/24/20 2257                This chart was dictated using voice recognition software/Dragon. Despite best efforts to proofread, errors can occur which can change the meaning. Any change  was purely unintentional.     Damon Anderson 10/24/20 2307    Minna Antis, MD 10/24/20 2329

## 2020-10-27 ENCOUNTER — Other Ambulatory Visit: Payer: Self-pay

## 2020-10-27 ENCOUNTER — Encounter: Payer: Self-pay | Admitting: Emergency Medicine

## 2020-10-27 ENCOUNTER — Emergency Department
Admission: EM | Admit: 2020-10-27 | Discharge: 2020-10-27 | Disposition: A | Discharge status: Home / Self Care | Payer: BC Managed Care – PPO | Attending: Emergency Medicine | Admitting: Emergency Medicine

## 2020-10-27 DIAGNOSIS — Z23 Encounter for immunization: Secondary | ICD-10-CM | POA: Diagnosis not present

## 2020-10-27 DIAGNOSIS — Z96631 Presence of right artificial wrist joint: Secondary | ICD-10-CM | POA: Insufficient documentation

## 2020-10-27 DIAGNOSIS — Z794 Long term (current) use of insulin: Secondary | ICD-10-CM | POA: Insufficient documentation

## 2020-10-27 DIAGNOSIS — Z87891 Personal history of nicotine dependence: Secondary | ICD-10-CM | POA: Insufficient documentation

## 2020-10-27 DIAGNOSIS — Z7982 Long term (current) use of aspirin: Secondary | ICD-10-CM | POA: Diagnosis not present

## 2020-10-27 DIAGNOSIS — Z7984 Long term (current) use of oral hypoglycemic drugs: Secondary | ICD-10-CM | POA: Insufficient documentation

## 2020-10-27 DIAGNOSIS — Z79899 Other long term (current) drug therapy: Secondary | ICD-10-CM | POA: Insufficient documentation

## 2020-10-27 DIAGNOSIS — Z203 Contact with and (suspected) exposure to rabies: Secondary | ICD-10-CM | POA: Diagnosis not present

## 2020-10-27 DIAGNOSIS — I10 Essential (primary) hypertension: Secondary | ICD-10-CM | POA: Insufficient documentation

## 2020-10-27 DIAGNOSIS — E119 Type 2 diabetes mellitus without complications: Secondary | ICD-10-CM | POA: Insufficient documentation

## 2020-10-27 MED ORDER — RABIES VACCINE, PCEC IM SUSR
1.0000 mL | Freq: Once | INTRAMUSCULAR | Status: AC
  Administered 2020-10-27: 1 mL via INTRAMUSCULAR
  Filled 2020-10-27: qty 1

## 2020-10-27 NOTE — Discharge Instructions (Addendum)
Call [facility].  Their phone number is listed on your discharge papers.  If they have the remaining rabies vaccine you may go there.

## 2020-10-27 NOTE — ED Provider Notes (Signed)
[facility] Emergency Department Provider Note  ____________________________________________   Event Date/Time   First MD Initiated Contact with Patient 10/27/20 1055     (approximate)  I have reviewed the triage vital signs and the nursing notes.   HISTORY  Chief Complaint Rabies Injection   HPI Damon Anderson is a 50 y.o. male returns for continued rabies vaccines.  Patient was seen on 10/24/2020 for original rabies immunizations.  He did not have any difficulty with that.         Past Medical History:  Diagnosis Date   Diabetes mellitus without complication (HCC)    Type 1 with insulin pump   Hypertension     Patient Active Problem List   Diagnosis Date Noted   Acute osteomyelitis of toe, left (HCC) 10/24/2018   MSSA bacteremia 10/09/2018    Past Surgical History:  Procedure Laterality Date   APPENDECTOMY     ELBOW FRACTURE SURGERY Right    INCISION AND DRAINAGE Left 10/11/2018   Procedure: INCISION AND DRAINAGE WITH BONE BIOPSY;  Surgeon: Gwyneth Revels, DPM;  Location: [facility] ORS;  Service: Podiatry;  Laterality: Left;   JOINT REPLACEMENT Left    WRIST ARTHROPLASTY Right    pinning    Prior to Admission medications   Medication Sig Start Date End Date Taking? Authorizing Provider  amoxicillin-clavulanate (AUGMENTIN) 875-125 MG tablet Take 1 tablet by mouth 2 (two) times daily for 10 days. 10/24/20 11/03/20  Orvil Feil, PA-C  aspirin EC 81 MG tablet Take 81 mg by mouth daily. Swallow whole.    [provider]  cetirizine (ZYRTEC) 10 MG tablet Take 10 mg by mouth daily.    [provider]  fluticasone (FLONASE) 50 MCG/ACT nasal spray Place 1 spray into the nose daily.    [provider]  insulin aspart (NOVOLOG) 100 UNIT/ML injection Inject 120 Units into the skin as directed.  01/20/18   [provider]  insulin glargine (LANTUS) 100 UNIT/ML injection Inject 56 Units into the skin daily as needed  (backup insulin therapy).  08/07/18 04/09/20  [provider]  lisinopril-hydrochlorothiazide (ZESTORETIC) 10-12.5 MG tablet Take 1 tablet by mouth daily for 30 days. 10/22/18 11/21/18  Orvil Feil, PA-C  rosuvastatin (CRESTOR) 40 MG tablet Take 40 mg by mouth daily. 07/19/18   [provider]  tadalafil (CIALIS) 20 MG tablet Take 1 tablet by mouth daily as needed. 03/10/20   [provider]  diphenhydrAMINE (BENADRYL ALLERGY) 25 mg capsule Take 1 capsule (25 mg total) by mouth every 6 (six) hours as needed for up to 5 days. 10/22/18 04/09/20  Orvil Feil, PA-C  famotidine (PEPCID) 20 MG tablet Take 1 tablet (20 mg total) by mouth 2 (two) times daily for 7 days. 10/22/18 04/09/20  Orvil Feil, PA-C  metFORMIN (GLUCOPHAGE-XR) 500 MG 24 hr tablet Take 500 mg by mouth daily with supper. 07/19/18 04/09/20  [provider]    Allergies Cefazolin and Demerol [meperidine hcl]  Family History  Problem Relation Age of Onset   Diabetes Mother    Hypertension Mother    Hypertension Father    Stroke Father     Social History Social History   Tobacco Use   Smoking status: Former    Pack years: 0.00    Types: Cigarettes    Quit date: 01/10/2018    Years since quitting: 2.7   Smokeless tobacco: Never  Vaping Use   Vaping Use: Never used  Substance Use Topics  Alcohol use: Never   Drug use: Never    Review of Systems Constitutional: No fever/chills Cardiovascular: Denies chest pain. Respiratory: Denies shortness of breath. Skin: Negative for rash. Neurological: Negative for headaches, focal weakness or numbness.  ____________________________________________   PHYSICAL EXAM:  VITAL SIGNS: ED Triage Vitals  Enc Vitals Group     BP      Pulse      Resp      Temp      Temp src      SpO2      Weight      Height      Head Circumference      Peak Flow      Pain Score      Pain Loc      Pain Edu?      Excl. in GC?    Constitutional:  Alert and oriented. Well appearing and in no acute distress. Eyes: Conjunctivae are normal.  Head: Atraumatic. Neck: No stridor.   Cardiovascular: Normal rate, regular rhythm. Grossly normal heart sounds.  Good peripheral circulation. Respiratory: Normal respiratory effort.  No retractions. Lungs CTAB. Musculoskeletal: No lower extremity tenderness nor edema.  No joint effusions. Neurologic:  Normal speech and language. No gross focal neurologic deficits are appreciated. No gait instability. Skin:  Skin is warm, dry and intact. No rash noted. Psychiatric: Mood and affect are normal. Speech and behavior are normal.  ____________________________________________   LABS (all labs ordered are listed, but only abnormal results are displayed)  Labs Reviewed - No data to display ____________________________________________  PROCEDURES  Procedure(s) performed (including Critical Care):  Procedures   ____________________________________________   INITIAL IMPRESSION / ASSESSMENT AND PLAN / ED COURSE  As part of my medical decision making, I reviewed the following data within the electronic MEDICAL RECORD NUMBER Notes from prior ED visits and [place] Controlled Substance Database  50 year old male presents to the ED for his second rabies.  He did not have any difficulty with the initial dosage.  He is quite upset as he was told to come to the emergency department and finding out that he has a $500 co-pay for emergency visits.  He was encouraged to begin using [place] urgent care for the remainder of his rabies vaccine.  ____________________________________________   FINAL CLINICAL IMPRESSION(S) / ED DIAGNOSES  Final diagnoses:  Encounter for repeat administration of rabies vaccination     ED Discharge Orders     None        Note:  This document was prepared using Dragon voice recognition software and may include unintentional dictation errors.    Tommi Rumps, PA-C 10/27/20  1629    Delton [place], MD 10/28/20 [phone]

## 2020-10-27 NOTE — ED Triage Notes (Signed)
States he is here for  2nd rabies  no other sx's

## 2020-10-31 ENCOUNTER — Encounter: Payer: Self-pay | Admitting: Emergency Medicine

## 2020-10-31 ENCOUNTER — Ambulatory Visit
Admission: EM | Admit: 2020-10-31 | Discharge: 2020-10-31 | Disposition: A | Discharge status: Home / Self Care | Payer: BC Managed Care – PPO | Attending: Physician Assistant | Admitting: Physician Assistant

## 2020-10-31 ENCOUNTER — Other Ambulatory Visit: Payer: Self-pay

## 2020-10-31 DIAGNOSIS — Z203 Contact with and (suspected) exposure to rabies: Secondary | ICD-10-CM

## 2020-10-31 MED ORDER — RABIES VACCINE, PCEC IM SUSR
1.0000 mL | Freq: Once | INTRAMUSCULAR | Status: AC
  Administered 2020-10-31: 1 mL via INTRAMUSCULAR

## 2020-10-31 NOTE — ED Triage Notes (Signed)
Pt presents to [facility] for day 7 rabies vaccine. He has tolerated other injections well.

## 2020-11-07 ENCOUNTER — Other Ambulatory Visit: Payer: Self-pay

## 2020-11-07 ENCOUNTER — Ambulatory Visit
Admission: EM | Admit: 2020-11-07 | Discharge: 2020-11-07 | Disposition: A | Discharge status: Home / Self Care | Payer: BC Managed Care – PPO | Attending: Family Medicine | Admitting: Family Medicine

## 2020-11-07 DIAGNOSIS — Z203 Contact with and (suspected) exposure to rabies: Secondary | ICD-10-CM | POA: Diagnosis not present

## 2020-11-07 MED ORDER — RABIES VACCINE, PCEC IM SUSR
1.0000 mL | Freq: Once | INTRAMUSCULAR | Status: AC
  Administered 2020-11-07: 1 mL via INTRAMUSCULAR

## 2020-11-07 NOTE — ED Triage Notes (Signed)
Patient states that he is here for day 14 rabies vaccine. States that he has tolerated previous vaccines well.

## 2021-01-14 ENCOUNTER — Other Ambulatory Visit: Payer: Self-pay | Admitting: Internal Medicine

## 2021-01-14 DIAGNOSIS — Q780 Osteogenesis imperfecta: Secondary | ICD-10-CM

## 2021-01-20 ENCOUNTER — Ambulatory Visit
Admission: RE | Admit: 2021-01-20 | Discharge: 2021-01-20 | Disposition: A | Discharge status: Home / Self Care | Payer: BC Managed Care – PPO | Source: Ambulatory Visit | Attending: Internal Medicine | Admitting: Internal Medicine

## 2021-01-20 ENCOUNTER — Other Ambulatory Visit: Payer: Self-pay

## 2021-01-20 DIAGNOSIS — Q780 Osteogenesis imperfecta: Secondary | ICD-10-CM | POA: Diagnosis present

## 2022-06-30 ENCOUNTER — Other Ambulatory Visit: Payer: Self-pay | Admitting: Internal Medicine

## 2022-06-30 DIAGNOSIS — R1011 Right upper quadrant pain: Secondary | ICD-10-CM

## 2022-06-30 DIAGNOSIS — R6881 Early satiety: Secondary | ICD-10-CM

## 2022-07-02 ENCOUNTER — Ambulatory Visit: Payer: BC Managed Care – PPO

## 2022-07-23 ENCOUNTER — Ambulatory Visit
Admission: RE | Admit: 2022-07-23 | Discharge: 2022-07-23 | Disposition: A | Discharge status: Home / Self Care | Payer: BC Managed Care – PPO | Source: Ambulatory Visit | Attending: Internal Medicine | Admitting: Internal Medicine

## 2022-07-23 DIAGNOSIS — R1011 Right upper quadrant pain: Secondary | ICD-10-CM | POA: Diagnosis present

## 2022-08-01 IMAGING — DX DG FOREARM 2V*R*
1 series · 1 of 1 positions shown · non-contrast
Comparison: None.

CLINICAL DATA: Cat bite, multiple wounds.

EXAM:
RIGHT FOREARM - 2 VIEW

[forearm lat]
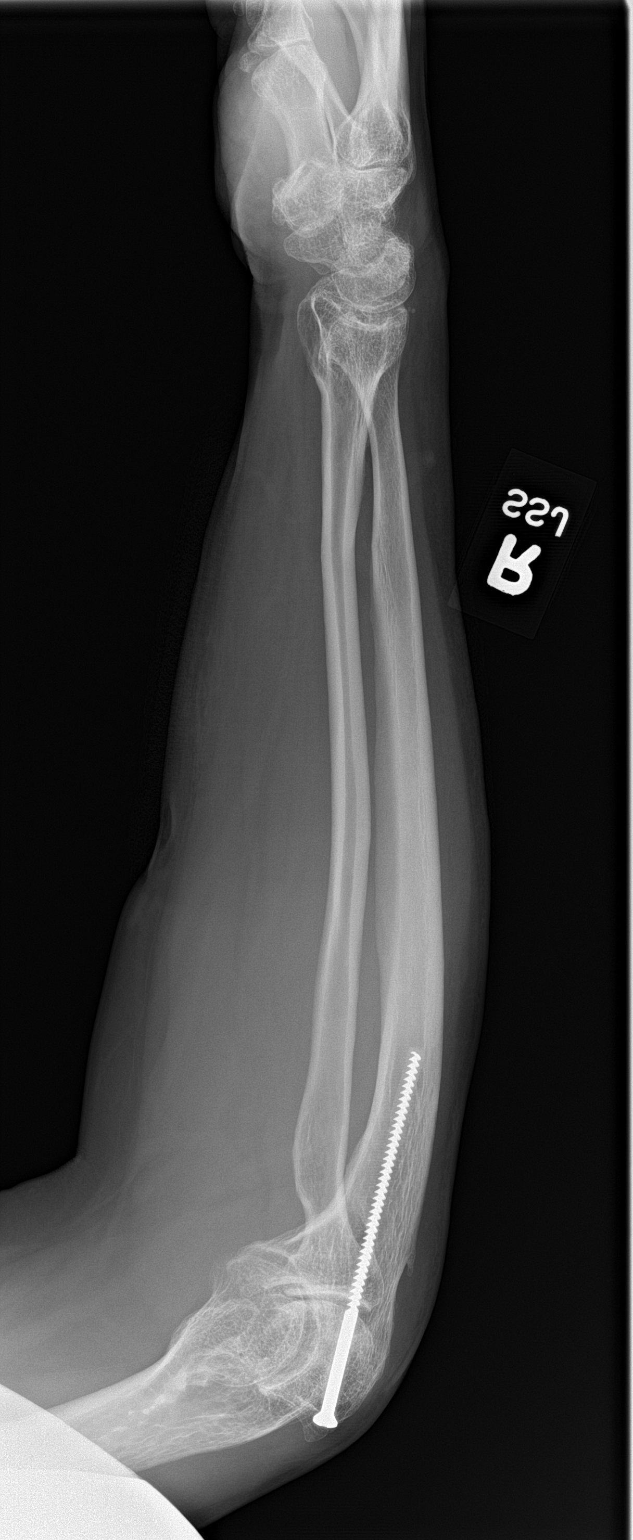

[1 of 1 positions shown; findings below may reference images not displayed]

FINDINGS: Some mild soft tissue irregularity along the proximal volar forearm
soft tissues of the could reflect laceration soft tissue injury.
Additional discrete sites of puncture wound and/or laceration of the
right forearm are not well visualized radiographically. Diffuse mild
soft tissue swelling along the volar tissues. No soft tissue gas or
retained foreign body. Remote posttraumatic deformity of the radius
and ulna with postsurgical changes or prior ORIF of the proximal
ulna secured by a partially threaded screw. Degenerative changes of
the wrist and elbow without gross traumatic malalignment.
IMPRESSION: Soft tissue irregularity along the proximal volar soft tissues of
the forearm, correlate for focal site of laceration or soft tissue
injury. Additional sites of puncture soft tissue injury not well
visualized radiographically.

No soft tissue gas or retained foreign body.

No acute or worrisome osseous abnormality.

The posttraumatic deformities of the radius and ulna with prior
proximal ulnar ORIF.

## 2022-08-06 ENCOUNTER — Encounter
Admission: RE | Admit: 2022-08-06 | Discharge: 2022-08-06 | Disposition: A | Discharge status: Home / Self Care | Payer: BC Managed Care – PPO | Source: Ambulatory Visit | Attending: Internal Medicine | Admitting: Internal Medicine

## 2022-08-06 DIAGNOSIS — R6881 Early satiety: Secondary | ICD-10-CM | POA: Insufficient documentation

## 2022-08-06 MED ORDER — TECHNETIUM TC 99M SULFUR COLLOID
2.1700 | Freq: Once | INTRAVENOUS | Status: AC | PRN
  Administered 2022-08-06: 2.17 via ORAL

## 2022-10-12 ENCOUNTER — Encounter: Payer: Self-pay | Admitting: Internal Medicine

## 2022-10-13 ENCOUNTER — Ambulatory Visit
Admission: RE | Admit: 2022-10-13 | Payer: BC Managed Care – PPO | Source: Home / Self Care | Admitting: Internal Medicine

## 2022-10-13 ENCOUNTER — Encounter: Admission: RE | Payer: Self-pay | Source: Home / Self Care

## 2022-10-13 SURGERY — ESOPHAGOGASTRODUODENOSCOPY (EGD) WITH PROPOFOL
Anesthesia: General
